# Patient Record
Sex: Female | Born: 1985 | Race: Black or African American | Hispanic: No | Marital: Single | Smoking: Never smoker
Health system: Southern US, Community
[De-identification: ages and names within clinical notes are randomized; demographics above are authoritative.]

## PROBLEM LIST (undated history)

## (undated) DIAGNOSIS — L309 Dermatitis, unspecified: Secondary | ICD-10-CM

## (undated) DIAGNOSIS — F32A Depression, unspecified: Secondary | ICD-10-CM

## (undated) DIAGNOSIS — R87629 Unspecified abnormal cytological findings in specimens from vagina: Secondary | ICD-10-CM

## (undated) DIAGNOSIS — F419 Anxiety disorder, unspecified: Secondary | ICD-10-CM

## (undated) DIAGNOSIS — F329 Major depressive disorder, single episode, unspecified: Secondary | ICD-10-CM

## (undated) DIAGNOSIS — A749 Chlamydial infection, unspecified: Secondary | ICD-10-CM

## (undated) HISTORY — PX: LEEP: SHX91

## (undated) HISTORY — DX: Dermatitis, unspecified: L30.9

## (undated) HISTORY — PX: NO PAST SURGERIES: SHX2092

---

## 2004-02-23 ENCOUNTER — Emergency Department (HOSPITAL_COMMUNITY): Admission: EM | Admit: 2004-02-23 | Discharge: 2004-02-24 | Payer: Self-pay | Admitting: Emergency Medicine

## 2005-06-07 ENCOUNTER — Emergency Department (HOSPITAL_COMMUNITY): Admission: EM | Admit: 2005-06-07 | Discharge: 2005-06-07 | Payer: Self-pay | Admitting: *Deleted

## 2005-11-14 ENCOUNTER — Inpatient Hospital Stay (HOSPITAL_COMMUNITY): Admission: AD | Admit: 2005-11-14 | Discharge: 2005-11-16 | Payer: Self-pay | Admitting: Obstetrics and Gynecology

## 2006-05-11 ENCOUNTER — Emergency Department (HOSPITAL_COMMUNITY): Admission: EM | Admit: 2006-05-11 | Discharge: 2006-05-11 | Payer: Self-pay | Admitting: Emergency Medicine

## 2007-10-26 ENCOUNTER — Inpatient Hospital Stay (HOSPITAL_COMMUNITY): Admission: AD | Admit: 2007-10-26 | Discharge: 2007-10-28 | Payer: Self-pay | Admitting: Obstetrics and Gynecology

## 2008-08-21 ENCOUNTER — Emergency Department (HOSPITAL_COMMUNITY): Admission: EM | Admit: 2008-08-21 | Discharge: 2008-08-21 | Payer: Self-pay | Admitting: Emergency Medicine

## 2008-09-13 ENCOUNTER — Emergency Department (HOSPITAL_COMMUNITY): Admission: EM | Admit: 2008-09-13 | Discharge: 2008-09-13 | Payer: Self-pay | Admitting: Emergency Medicine

## 2008-09-21 ENCOUNTER — Emergency Department (HOSPITAL_COMMUNITY): Admission: EM | Admit: 2008-09-21 | Discharge: 2008-09-21 | Payer: Self-pay | Admitting: Family Medicine

## 2009-07-27 ENCOUNTER — Emergency Department (HOSPITAL_COMMUNITY): Admission: EM | Admit: 2009-07-27 | Discharge: 2009-07-27 | Payer: Self-pay | Admitting: Emergency Medicine

## 2009-08-10 ENCOUNTER — Emergency Department (HOSPITAL_COMMUNITY): Admission: EM | Admit: 2009-08-10 | Discharge: 2009-08-10 | Payer: Self-pay | Admitting: Emergency Medicine

## 2009-12-21 ENCOUNTER — Emergency Department (HOSPITAL_COMMUNITY): Admission: EM | Admit: 2009-12-21 | Discharge: 2009-12-21 | Payer: Self-pay | Admitting: Emergency Medicine

## 2010-02-06 ENCOUNTER — Emergency Department (HOSPITAL_COMMUNITY): Admission: EM | Admit: 2010-02-06 | Discharge: 2010-02-06 | Payer: Self-pay | Admitting: Family Medicine

## 2010-09-06 LAB — POCT I-STAT, CHEM 8
Calcium, Ion: 1.14 mmol/L (ref 1.12–1.32)
Chloride: 106 mEq/L (ref 96–112)
HCT: 41 % (ref 36.0–46.0)
Sodium: 142 mEq/L (ref 135–145)
TCO2: 23 mmol/L (ref 0–100)

## 2010-09-06 LAB — URINALYSIS, ROUTINE W REFLEX MICROSCOPIC
Bilirubin Urine: NEGATIVE
Glucose, UA: NEGATIVE mg/dL
Hgb urine dipstick: NEGATIVE
Ketones, ur: NEGATIVE mg/dL
Protein, ur: >300 mg/dL — AB
Urobilinogen, UA: 0.2 mg/dL (ref 0.0–1.0)

## 2010-09-06 LAB — DIFFERENTIAL
Basophils Absolute: 0 10*3/uL (ref 0.0–0.1)
Eosinophils Absolute: 0.2 10*3/uL (ref 0.0–0.7)
Eosinophils Relative: 2 % (ref 0–5)
Lymphocytes Relative: 14 % (ref 12–46)
Neutrophils Relative %: 81 % — ABNORMAL HIGH (ref 43–77)

## 2010-09-06 LAB — D-DIMER, QUANTITATIVE: D-Dimer, Quant: 0.31 ug/mL-FEU (ref 0.00–0.48)

## 2010-09-06 LAB — URINE MICROSCOPIC-ADD ON

## 2010-09-06 LAB — CBC
HCT: 40.3 % (ref 36.0–46.0)
Platelets: 311 10*3/uL (ref 150–400)
RDW: 13.9 % (ref 11.5–15.5)

## 2010-09-06 LAB — ETHANOL: Alcohol, Ethyl (B): <5 mg/dL (ref 0–10)

## 2010-11-28 ENCOUNTER — Inpatient Hospital Stay (INDEPENDENT_AMBULATORY_CARE_PROVIDER_SITE_OTHER)
Admission: RE | Admit: 2010-11-28 | Discharge: 2010-11-28 | Disposition: A | Discharge status: Home / Self Care | Payer: Medicaid Other | Source: Ambulatory Visit | Attending: Emergency Medicine | Admitting: Emergency Medicine

## 2010-11-28 ENCOUNTER — Ambulatory Visit (INDEPENDENT_AMBULATORY_CARE_PROVIDER_SITE_OTHER): Payer: Medicaid Other

## 2010-11-28 DIAGNOSIS — R071 Chest pain on breathing: Secondary | ICD-10-CM

## 2011-02-21 LAB — CBC
Hemoglobin: 9.9 — ABNORMAL LOW
Platelets: 217
RBC: 3.16 — ABNORMAL LOW
WBC: 14 — ABNORMAL HIGH

## 2011-02-21 LAB — RPR: RPR Ser Ql: NONREACTIVE

## 2011-07-02 ENCOUNTER — Other Ambulatory Visit: Payer: Self-pay

## 2011-07-02 ENCOUNTER — Encounter (HOSPITAL_BASED_OUTPATIENT_CLINIC_OR_DEPARTMENT_OTHER): Payer: Self-pay | Admitting: *Deleted

## 2011-07-02 ENCOUNTER — Emergency Department (HOSPITAL_BASED_OUTPATIENT_CLINIC_OR_DEPARTMENT_OTHER)
Admission: EM | Admit: 2011-07-02 | Discharge: 2011-07-02 | Disposition: A | Discharge status: Home / Self Care | Payer: PRIVATE HEALTH INSURANCE | Attending: Emergency Medicine | Admitting: Emergency Medicine

## 2011-07-02 DIAGNOSIS — R079 Chest pain, unspecified: Secondary | ICD-10-CM | POA: Insufficient documentation

## 2011-07-02 DIAGNOSIS — F329 Major depressive disorder, single episode, unspecified: Secondary | ICD-10-CM | POA: Insufficient documentation

## 2011-07-02 DIAGNOSIS — K0889 Other specified disorders of teeth and supporting structures: Secondary | ICD-10-CM

## 2011-07-02 DIAGNOSIS — K089 Disorder of teeth and supporting structures, unspecified: Secondary | ICD-10-CM | POA: Insufficient documentation

## 2011-07-02 DIAGNOSIS — F411 Generalized anxiety disorder: Secondary | ICD-10-CM

## 2011-07-02 DIAGNOSIS — F3289 Other specified depressive episodes: Secondary | ICD-10-CM | POA: Insufficient documentation

## 2011-07-02 HISTORY — DX: Depression, unspecified: F32.A

## 2011-07-02 HISTORY — DX: Major depressive disorder, single episode, unspecified: F32.9

## 2011-07-02 HISTORY — DX: Anxiety disorder, unspecified: F41.9

## 2011-07-02 MED ORDER — HYDROXYZINE HCL 25 MG PO TABS: ORAL_TABLET | ORAL | Status: DC

## 2011-07-02 MED ORDER — PENICILLIN V POTASSIUM 500 MG PO TABS: 500.0000 mg | ORAL_TABLET | Freq: Four times a day (QID) | ORAL | Status: AC

## 2011-07-02 NOTE — ED Provider Notes (Signed)
History     CSN: [id number]  Arrival date & time 07/02/11  1254   First MD Initiated Contact with Patient 07/02/11 1351      Chief Complaint  Patient presents with  . Anxiety    (Consider location/radiation/quality/duration/timing/severity/associated sxs/prior treatment) HPI Comments: Patient said she had chest pain starting around 7:15 this morning. The pain would come and go. She thought it would get better and so she went to work. 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 Therefore she came to the ED for evaluation.  Patient's mother says that the main thing is going on with patient recently was that she has a badly decayed  tooth and needs to be extracted.  Patient is a 26 y.o. female presenting with chest pain. The history is provided by the patient and a relative. No language interpreter was used.  Chest Pain The chest pain began 6 - 12 hours ago. Episode Length: Patient says the pain waxes and wanes; she still feels some of the pain now. It is felt in the precordial region. Chest pain occurs constantly. Associated with: No specific precipitating cause. At its most intense, the pain is at 8/10. The pain is currently at 8/10. The quality of the pain is described as aching. The pain does not radiate. Pertinent negatives for primary symptoms include no fever. She tried nothing for the symptoms. Risk factors include no known risk factors.     Past Medical History  Diagnosis Date  . Anxiety   . Depression     History reviewed. No pertinent past surgical history.  No family history on file.  History  Substance Use Topics  . Smoking status: Never Smoker   . Smokeless tobacco: Never Used  . Alcohol Use: No    OB History    Grav Para Term Preterm Abortions TAB SAB Ect Mult Living                  Review of Systems  Constitutional: Negative.  Negative for fever and chills.  HENT: Negative.   Eyes: Negative.    Respiratory: Negative.   Cardiovascular: Positive for chest pain.  Gastrointestinal: Negative.   Genitourinary: Negative.   Musculoskeletal: Negative.   Skin: Negative.   Neurological: Negative.   Psychiatric/Behavioral: Negative.     Allergies  Review of patient's allergies indicates no known allergies.  Home Medications  No current outpatient prescriptions on file.  BP 131/81  Pulse 100  Temp(Src) 98.2 F (36.8 C) (Oral)  Resp 20  Ht 5\' 6"  (1.676 m)  Wt 115 lb (52.164 kg)  BMI 18.56 kg/m2  SpO2 100%  LMP 06/17/2011  Physical Exam  Nursing note and vitals reviewed. Constitutional: She is oriented to person, place, and time. She appears well-developed and well-nourished. No distress.  HENT:  Head: Normocephalic and atraumatic.  Right Ear: External ear normal.  Left Ear: External ear normal.       She has a severely decayed right lower first molar.    Eyes: Conjunctivae and EOM are normal. Pupils are equal, round, and reactive to light.  Neck: Normal range of motion. Neck supple.  Cardiovascular: Normal rate, regular rhythm and normal heart sounds.   Pulmonary/Chest: Effort normal and breath sounds normal.  Musculoskeletal: Normal range of motion.  Neurological: She is alert and oriented to person, place, and time.       No sensory or motor deficit.  Skin: Skin  is warm and dry.  Psychiatric: She has a normal mood and affect. Her behavior is normal.    ED Course  Procedures (including critical care time)   1:12 PM  Date: 07/02/2011  Rate:86  Rhythm: normal sinus rhythm  QRS Axis: normal  Intervals: normal  ST/T Wave abnormalities: normal  Conduction Disutrbances:none  Narrative Interpretation: Normal EKG  Old EKG Reviewed: unchanged    Carleene Cooper III, MD 07/02/11 1313  DISP:  Exam shows a decayed tooth, otherwise is normal.  Rx Pen VK 500 mg qid for dental infection, advised to have tooth extracted. Rx anxiety reaction with hydroxyzine prn.    1.  Anxiety reaction   2. Toothache             Carleene Cooper III, MD 07/02/11 [phone]

## 2011-07-02 NOTE — ED Notes (Signed)
Patient states she developed chest pains last night after eating dinner.  States pain worsened this am.  [name] to work while sitting at her desk, she developed intense chest pain, denies sweating, nausea or radiation of pain.  States she has had the same since age 26 yrs, but symptoms have worsened as she had gotten older.

## 2011-07-02 NOTE — ED Provider Notes (Deleted)
1:12 PM  Date: 07/02/2011  Rate:86  Rhythm: normal sinus rhythm  QRS Axis: normal  Intervals: normal  ST/T Wave abnormalities: normal  Conduction Disutrbances:none  Narrative Interpretation: Normal EKG  Old EKG Reviewed: unchanged    Carleene Cooper III, MD 07/02/11 [phone]

## 2011-07-02 NOTE — ED Notes (Signed)
EMS reports patient developed anxiety last night.  Symptoms worsened and is having tightness in her chest.  Hx of same for the last 8 years.  Not currently on medication.

## 2012-01-27 ENCOUNTER — Encounter (HOSPITAL_COMMUNITY): Payer: Self-pay | Admitting: *Deleted

## 2012-01-27 ENCOUNTER — Emergency Department (HOSPITAL_COMMUNITY)
Admission: EM | Admit: 2012-01-27 | Discharge: 2012-01-27 | Discharge status: Against Medical Advice | Payer: PRIVATE HEALTH INSURANCE | Attending: Emergency Medicine | Admitting: Emergency Medicine

## 2012-01-27 DIAGNOSIS — R059 Cough, unspecified: Secondary | ICD-10-CM | POA: Insufficient documentation

## 2012-01-27 DIAGNOSIS — R05 Cough: Secondary | ICD-10-CM | POA: Insufficient documentation

## 2012-01-27 DIAGNOSIS — R51 Headache: Secondary | ICD-10-CM | POA: Insufficient documentation

## 2012-01-27 DIAGNOSIS — J3489 Other specified disorders of nose and nasal sinuses: Secondary | ICD-10-CM | POA: Insufficient documentation

## 2012-01-27 NOTE — ED Notes (Signed)
"  I got this bad headache, think I got a sinus infection" nasal stuffiness, pt coughing non productive, symptoms started this morning

## 2012-01-27 NOTE — ED Notes (Signed)
Call to room 8, no answer

## 2012-01-27 NOTE — ED Notes (Signed)
Called 2nd time with no answer

## 2012-03-27 ENCOUNTER — Emergency Department (HOSPITAL_BASED_OUTPATIENT_CLINIC_OR_DEPARTMENT_OTHER)
Admission: EM | Admit: 2012-03-27 | Discharge: 2012-03-27 | Disposition: A | Discharge status: Home / Self Care | Payer: Self-pay | Attending: Emergency Medicine | Admitting: Emergency Medicine

## 2012-03-27 ENCOUNTER — Emergency Department (HOSPITAL_BASED_OUTPATIENT_CLINIC_OR_DEPARTMENT_OTHER): Payer: Self-pay

## 2012-03-27 ENCOUNTER — Encounter (HOSPITAL_BASED_OUTPATIENT_CLINIC_OR_DEPARTMENT_OTHER): Payer: Self-pay | Admitting: Family Medicine

## 2012-03-27 DIAGNOSIS — F411 Generalized anxiety disorder: Secondary | ICD-10-CM | POA: Insufficient documentation

## 2012-03-27 DIAGNOSIS — Z79899 Other long term (current) drug therapy: Secondary | ICD-10-CM | POA: Insufficient documentation

## 2012-03-27 DIAGNOSIS — F3289 Other specified depressive episodes: Secondary | ICD-10-CM | POA: Insufficient documentation

## 2012-03-27 DIAGNOSIS — R072 Precordial pain: Secondary | ICD-10-CM | POA: Insufficient documentation

## 2012-03-27 DIAGNOSIS — R079 Chest pain, unspecified: Secondary | ICD-10-CM

## 2012-03-27 DIAGNOSIS — F329 Major depressive disorder, single episode, unspecified: Secondary | ICD-10-CM | POA: Insufficient documentation

## 2012-03-27 LAB — BASIC METABOLIC PANEL
CO2: 24 mEq/L (ref 19–32)
Calcium: 9 mg/dL (ref 8.4–10.5)
GFR calc non Af Amer: >90 mL/min (ref >90)
Potassium: 3.2 mEq/L — ABNORMAL LOW (ref 3.5–5.1)
Sodium: 140 mEq/L (ref 135–145)

## 2012-03-27 LAB — CBC WITH DIFFERENTIAL/PLATELET
Basophils Absolute: 0 10*3/uL (ref 0.0–0.1)
Eosinophils Absolute: 0.3 10*3/uL (ref 0.0–0.7)
Eosinophils Relative: 4 % (ref 0–5)
Lymphocytes Relative: 44 % (ref 12–46)
MCV: 91.4 fL (ref 78.0–100.0)
Neutrophils Relative %: 47 % (ref 43–77)
Platelets: 256 10*3/uL (ref 150–400)
RDW: 12.5 % (ref 11.5–15.5)
WBC: 8 10*3/uL (ref 4.0–10.5)

## 2012-03-27 LAB — TROPONIN I: Troponin I: <0.3 ng/mL (ref <0.30)

## 2012-03-27 LAB — D-DIMER, QUANTITATIVE: D-Dimer, Quant: <0.27 ug/mL-FEU (ref 0.00–0.48)

## 2012-03-27 MED ORDER — NAPROXEN 500 MG PO TABS: 500.0000 mg | ORAL_TABLET | Freq: Two times a day (BID) | ORAL | Status: DC

## 2012-03-27 NOTE — ED Notes (Signed)
Patient transported to X-ray 

## 2012-03-27 NOTE — ED Notes (Signed)
Pt c/o central chest pain that started last night. Pt sts pain feels like "pins and needles". Pt denies shob, n/v/d. Pt denies cardiac history.

## 2012-03-27 NOTE — ED Provider Notes (Addendum)
History     CSN: [id number]  Arrival date & time 03/27/12  1318   First MD Initiated Contact with Patient 03/27/12 1340      Chief Complaint  Patient presents with  . Chest Pain    (Consider location/radiation/quality/duration/timing/severity/associated sxs/prior treatment) The history is provided by the patient.   patient is a 26 year old female with acute onset of substernal chest pain between midnight and 3 it was constant reoccurred again this morning has been constant up until now. Substernal in location nonradiating feels like pins and sharp. No shortness of breath no nausea no vomiting no diaphoresis no abdominal pain. No history of chest pain was seen with more of an anxiety shortness of breath problem back in February this is not like that. Patient denies any leg swelling no injuries. Patient has no cardiac risk factors. No PE risk factors. However the chest pain is definitely made worse with breathing. Patient states the pain is 10 out of 10.  Past Medical History  Diagnosis Date  . Anxiety   . Depression     History reviewed. No pertinent past surgical history.  No family history on file.  History  Substance Use Topics  . Smoking status: Never Smoker   . Smokeless tobacco: Never Used  . Alcohol Use: No    OB History    Grav Para Term Preterm Abortions TAB SAB Ect Mult Living                  Review of Systems  Constitutional: Negative for fever.  HENT: Negative for neck pain.   Respiratory: Negative for cough and shortness of breath.   Cardiovascular: Positive for chest pain. Negative for palpitations and leg swelling.  Gastrointestinal: Negative for nausea, vomiting and abdominal pain.  Genitourinary: Negative for dysuria.  Musculoskeletal: Negative for back pain.  Skin: Negative for rash.  Neurological: Negative for headaches.  Hematological: Does not bruise/bleed easily.    Allergies  Review of patient's allergies indicates no known  allergies.  Home Medications   Current Outpatient Rx  Name Route Sig Dispense Refill  . HYDROXYZINE HCL 25 MG PO TABS  Take one tablet if needed for anxiety attack. 20 tablet 0  . NAPROXEN 500 MG PO TABS Oral Take 1 tablet (500 mg total) by mouth 2 (two) times daily. 14 tablet 0    BP 119/91  Pulse 86  Temp 98.1 F (36.7 C) (Oral)  Resp 18  Ht 5' 5.5" (1.664 m)  Wt 115 lb (52.164 kg)  BMI 18.85 kg/m2  SpO2 100%  LMP 02/12/2012  Physical Exam  Nursing note and vitals reviewed. Constitutional: She is oriented to person, place, and time. She appears well-developed and well-nourished. No distress.  HENT:  Head: Normocephalic and atraumatic.  Mouth/Throat: Oropharynx is clear and moist.  Eyes: Conjunctivae normal and EOM are normal. Pupils are equal, round, and reactive to light.  Neck: Normal range of motion. Neck supple.  Cardiovascular: Normal rate, regular rhythm, normal heart sounds and intact distal pulses.   No murmur heard. Pulmonary/Chest: Effort normal and breath sounds normal. No respiratory distress. She has no wheezes. She has no rales. She exhibits no tenderness.  Abdominal: Soft. Bowel sounds are normal. There is no tenderness.  Musculoskeletal: Normal range of motion. She exhibits no edema.  Neurological: She is alert and oriented to person, place, and time. No cranial nerve deficit. She exhibits normal muscle tone. Coordination normal.  Skin: Skin is warm. No rash noted.    ED  Course  Procedures (including critical care time)  Labs Reviewed  CBC WITH DIFFERENTIAL - Abnormal; Notable for the following:    RBC 3.73 (*)     HCT 34.1 (*)     All other components within normal limits  BASIC METABOLIC PANEL - Abnormal; Notable for the following:    Potassium 3.2 (*)     All other components within normal limits  TROPONIN I  D-DIMER, QUANTITATIVE   Dg Chest 2 View  03/27/2012  *RADIOLOGY REPORT*  Clinical Data: 26 year old female with midsternal chest pain.   CHEST - 2 VIEW  Comparison: 11/28/2010.  Findings: Stable and normal lung volumes. Normal cardiac size and mediastinal contours.  Visualized tracheal air column is within normal limits.  The lungs are clear.  No pneumothorax or effusion. No acute osseous abnormality identified.  IMPRESSION: Negative, no acute cardiopulmonary abnormality.   Original Report Authenticated By: Erskine Speed, M.D.      Date: 03/27/2012  Rate: 84  Rhythm: normal sinus rhythm  QRS Axis: normal  Intervals: normal  ST/T Wave abnormalities: normal  Conduction Disutrbances:none  Narrative Interpretation:   Old EKG Reviewed: unchanged From 07/02/11   Results for orders placed during the hospital encounter of 03/27/12  CBC WITH DIFFERENTIAL      Component Value Range   WBC 8.0  4.0 - 10.5 K/uL   RBC 3.73 (*) 3.87 - 5.11 MIL/uL   Hemoglobin 12.1  12.0 - 15.0 g/dL   HCT 16.1 (*) 09.6 - 04.5 %   MCV 91.4  78.0 - 100.0 fL   MCH 32.4  26.0 - 34.0 pg   MCHC 35.5  30.0 - 36.0 g/dL   RDW 40.9  81.1 - 91.4 %   Platelets 256  150 - 400 K/uL   Neutrophils Relative 47  43 - 77 %   Neutro Abs 3.8  1.7 - 7.7 K/uL   Lymphocytes Relative 44  12 - 46 %   Lymphs Abs 3.5  0.7 - 4.0 K/uL   Monocytes Relative 4  3 - 12 %   Monocytes Absolute 0.3  0.1 - 1.0 K/uL   Eosinophils Relative 4  0 - 5 %   Eosinophils Absolute 0.3  0.0 - 0.7 K/uL   Basophils Relative 0  0 - 1 %   Basophils Absolute 0.0  0.0 - 0.1 K/uL  BASIC METABOLIC PANEL      Component Value Range   Sodium 140  135 - 145 mEq/L   Potassium 3.2 (*) 3.5 - 5.1 mEq/L   Chloride 104  96 - 112 mEq/L   CO2 24  19 - 32 mEq/L   Glucose, Bld 90  70 - 99 mg/dL   BUN 7  6 - 23 mg/dL   Creatinine, Ser 7.82  0.50 - 1.10 mg/dL   Calcium 9.0  8.4 - 95.6 mg/dL   GFR calc non Af Amer >90  >90 mL/min   GFR calc Af Amer >90  >90 mL/min  TROPONIN I      Component Value Range   Troponin I <0.30  <0.30 ng/mL  D-DIMER, QUANTITATIVE      Component Value Range   D-Dimer, Quant <0.27   0.00 - 0.48 ug/mL-FEU     1. Chest pain       MDM     Patient with a significant chest pain not worried about cardiac event. EKG without acute changes. Troponin is negative. Main concern would be more for pulmonary embolism d-dimer is pending if  negative patient can be discharged home. Pain is pleuritic in nature. Did consider wells criteria but patient is very concerned about having a blood clot so will proceed with CT angios d-dimer is elevated.  D-dimer does came back is not elevated we'll discharge patient home with atypical chest pain. If the chest x-ray is negative.     Shelda Jakes, MD 03/27/12 1459  Shelda Jakes, MD 03/27/12 1525

## 2012-10-08 ENCOUNTER — Encounter (HOSPITAL_COMMUNITY): Payer: Self-pay | Admitting: Cardiology

## 2012-10-08 ENCOUNTER — Emergency Department (HOSPITAL_COMMUNITY)
Admission: EM | Admit: 2012-10-08 | Discharge: 2012-10-08 | Disposition: A | Discharge status: Home / Self Care | Payer: Self-pay | Attending: Emergency Medicine | Admitting: Emergency Medicine

## 2012-10-08 DIAGNOSIS — Y9389 Activity, other specified: Secondary | ICD-10-CM | POA: Insufficient documentation

## 2012-10-08 DIAGNOSIS — Y929 Unspecified place or not applicable: Secondary | ICD-10-CM | POA: Insufficient documentation

## 2012-10-08 DIAGNOSIS — W260XXA Contact with knife, initial encounter: Secondary | ICD-10-CM | POA: Insufficient documentation

## 2012-10-08 DIAGNOSIS — S61211A Laceration without foreign body of left index finger without damage to nail, initial encounter: Secondary | ICD-10-CM

## 2012-10-08 DIAGNOSIS — S61209A Unspecified open wound of unspecified finger without damage to nail, initial encounter: Secondary | ICD-10-CM | POA: Insufficient documentation

## 2012-10-08 DIAGNOSIS — Z8659 Personal history of other mental and behavioral disorders: Secondary | ICD-10-CM | POA: Insufficient documentation

## 2012-10-08 NOTE — ED Notes (Signed)
Pt discharged.Vital signs stable and GCS 15 

## 2012-10-08 NOTE — ED Notes (Signed)
Pt reports that that she was opening a jar and cut her left index finger. Bleeding controlled.

## 2012-10-08 NOTE — ED Provider Notes (Signed)
History    This chart was scribed for [name] (PA) non-physician practitioner working with Gerhard Munch, MD by Sofie Rower, ED Scribe. This patient was seen in room TR11C/TR11C and the patient's care was started at 7:01PM.   CSN: [id number]  Arrival date & time 10/08/12  1846   First MD Initiated Contact with Patient 10/08/12 1901      Chief Complaint  Patient presents with  . Laceration    (Consider location/radiation/quality/duration/timing/severity/associated sxs/prior treatment) The history is provided by the patient. No language interpreter was used.    Mackenzie Garcia is a 27 y.o. female , with a hx of anxiety and depression, who presents to the Emergency Department complaining of sudden, moderate, laceration, located at the left 2nd finger, onset today (10/08/12). The pt reports she was trying to open the top of a jar with a knife, where which she suddenly slipped, immediately cutting her left second finger. The pt has applied a bandage dressing PTA which controls the bleeding associated with the laceration. The pt is up to date on her tetanus immunization.   The pt does not smoke or drink alcohol.     Past Medical History  Diagnosis Date  . Anxiety   . Depression     History reviewed. No pertinent past surgical history.  History reviewed. No pertinent family history.  History  Substance Use Topics  . Smoking status: Never Smoker   . Smokeless tobacco: Never Used  . Alcohol Use: No    OB History   Grav Para Term Preterm Abortions TAB SAB Ect Mult Living                  Review of Systems  Constitutional: Negative for fever and chills.  Skin: Positive for wound.  All other systems reviewed and are negative.    Allergies  Review of patient's allergies indicates no known allergies.  Home Medications   Current Outpatient Rx  Name  Route  Sig  Dispense  Refill  . medroxyPROGESTERone (DEPO-PROVERA) 150 MG/ML injection   Intramuscular   Inject  150 mg into the muscle every 3 (three) months.           BP 130/82  Pulse 98  Temp(Src) 98.3 F (36.8 C) (Oral)  Resp 18  SpO2 98%  Physical Exam  Nursing note and vitals reviewed. Constitutional: She is oriented to person, place, and time. She appears well-developed and well-nourished. No distress.  HENT:  Head: Normocephalic and atraumatic.  Right Ear: External ear normal.  Left Ear: External ear normal.  Nose: Nose normal.  Mouth/Throat: Oropharynx is clear and moist.  Eyes: Conjunctivae are normal.  Neck: Normal range of motion.  Cardiovascular: Normal rate, regular rhythm and normal heart sounds.   Pulmonary/Chest: Effort normal and breath sounds normal. No stridor. No respiratory distress. She has no wheezes. She has no rales.  Abdominal: Soft. She exhibits no distension.  Musculoskeletal: Normal range of motion.  Neurological: She is alert and oriented to person, place, and time. She has normal strength.     Skin: Skin is warm and dry. Laceration noted. She is not diaphoretic. No erythema.  2 cm laceration located at the left index finger. Flexion and extension intact, only mildly tender to palpation  neurovascularly intact   Psychiatric: She has a normal mood and affect. Her behavior is normal.    ED Course  Procedures (including critical care time)  DIAGNOSTIC STUDIES: Oxygen Saturation is 98% on room air, normal by my interpretation.  COORDINATION OF CARE:    7:20 PM- Treatment plan concerning laceration repair discussed with patient. Pt agrees with treatment.  LACERATION REPAIR Performed by: [name] Authorized by: [name] Consent: Verbal consent obtained. Risks and benefits: risks, benefits and alternatives were discussed Consent given by: patient Patient identity confirmed: provided demographic data Prepped and Draped in normal sterile fashion Wound explored  Laceration Location: left index finger  Laceration Length: 2  cm  No Foreign Bodies seen or palpated  Anesthesia: local infiltration  Local anesthetic: lidocaine 2%  Anesthetic total: 3 ml  Irrigation method: syringe Amount of cleaning: standard  Skin closure: 4-0 Ethilon  Number of sutures: 2  Technique: simple interrupted   Patient tolerance: Patient tolerated the procedure well with no immediate complications.     Labs Reviewed - No data to display No results found.   1. Laceration of left index finger without foreign body without damage to nail, initial encounter       MDM  Patient presents today with laceration of left index finger. No foreign bodies visualized. Laceration repair done without complication. Tetanus was up to date. No need for abx. Return in 7 days for suture removal. Return instructions given. Vital signs stable for discharge. Patient / Family / Caregiver informed of clinical course, understand medical decision-making process, and agree with plan.    I personally performed the services described in this documentation, which was scribed in my presence. The recorded information has been reviewed and is accurate.     Mora Bellman, PA-C 10/09/12 1148

## 2012-10-10 NOTE — ED Provider Notes (Signed)
Medical screening examination/treatment/procedure(s) were performed by non-physician practitioner and as supervising physician I was immediately available for consultation/collaboration.   Gerhard Munch, MD 10/10/12 [phone]

## 2013-12-16 IMAGING — CR DG CHEST 2V
2 series · 2 of 2 positions shown · non-contrast
Comparison: 11/28/2010.

CLINICAL DATA: 26-year-old female with midsternal chest pain.

CHEST - 2 VIEW

[w chest pa]
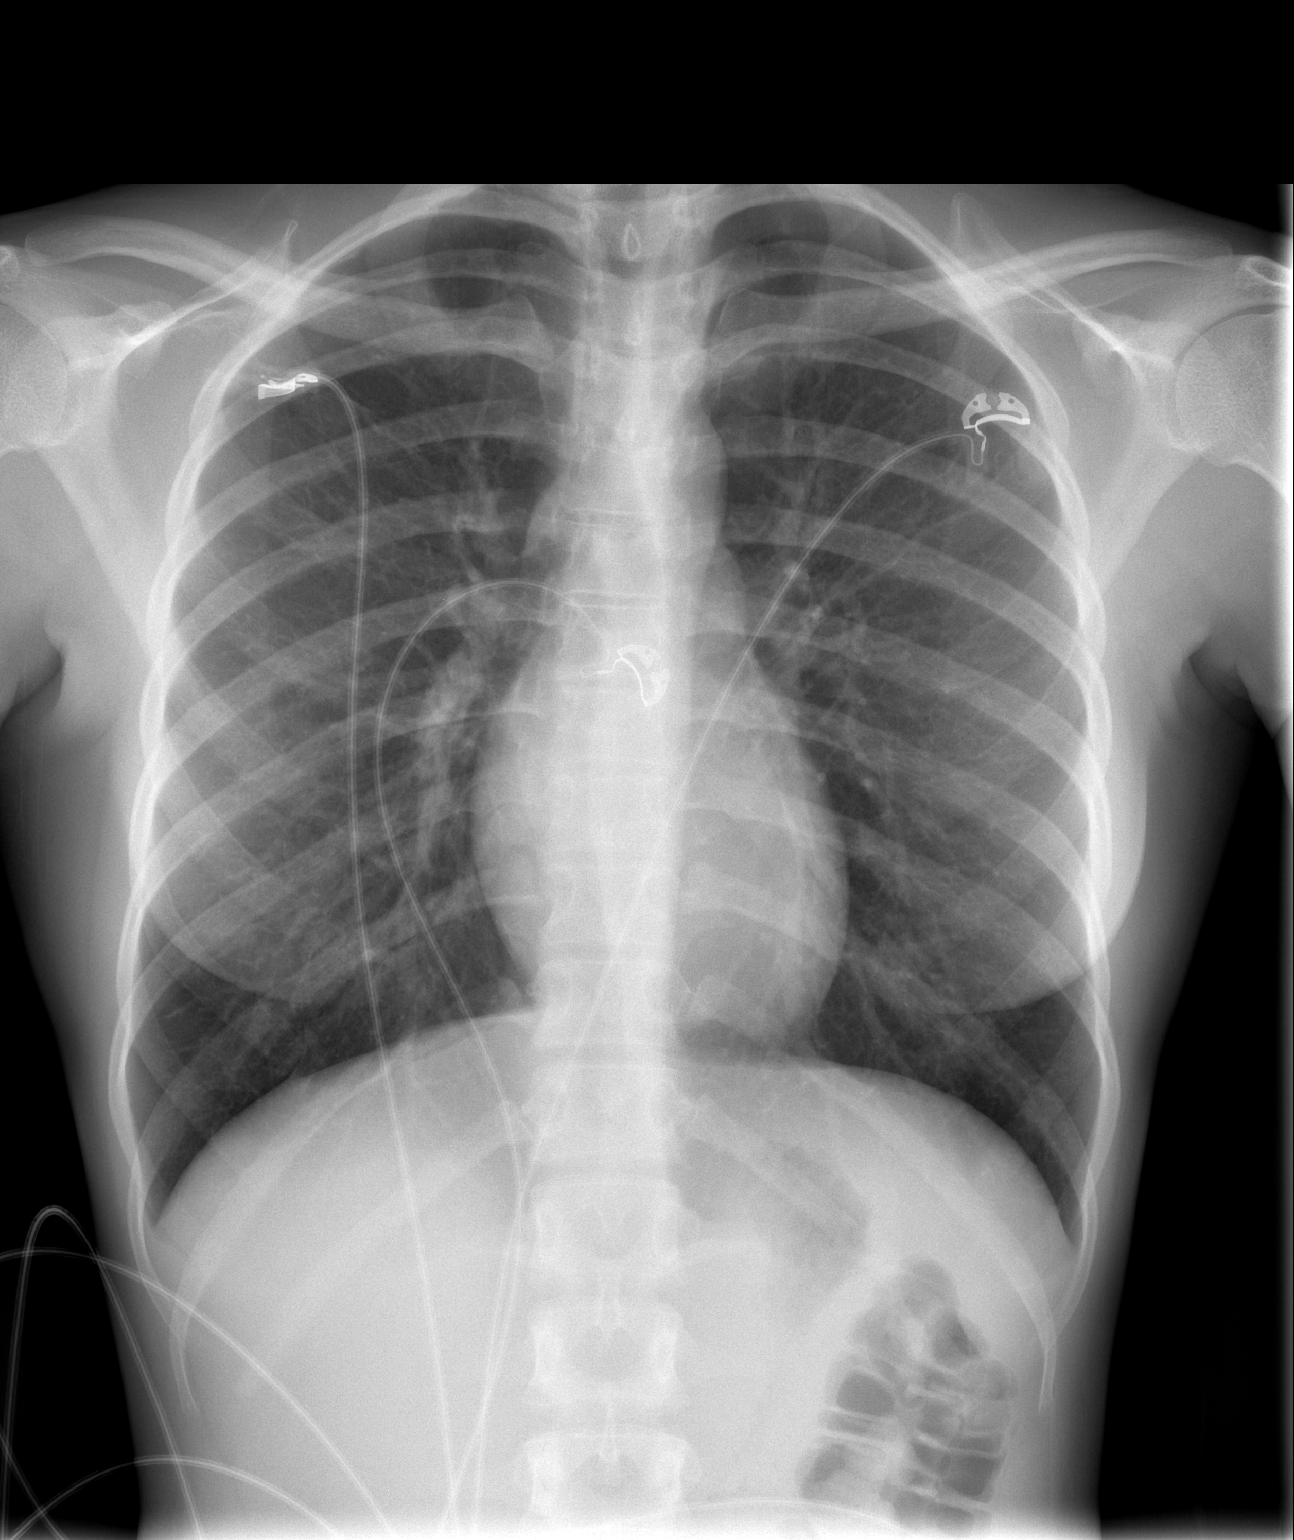

[w chest lat]
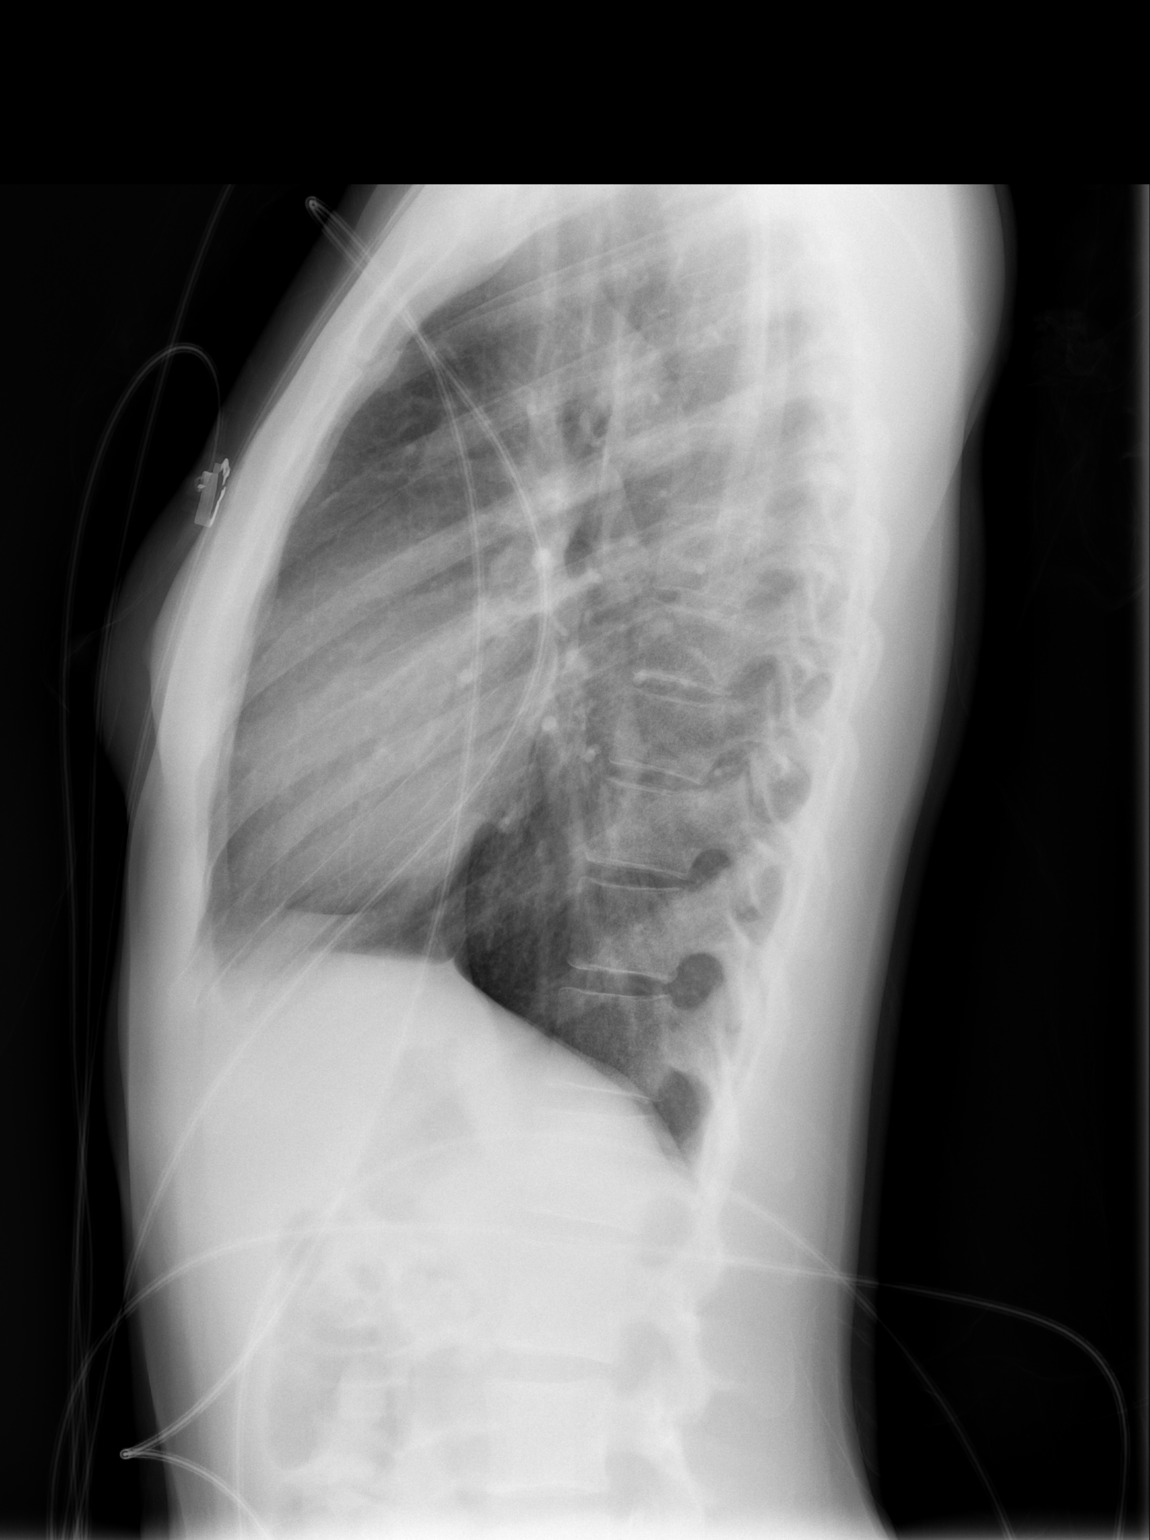

[2 of 2 positions shown; findings below may reference images not displayed]

FINDINGS: Stable and normal lung volumes. Normal cardiac size and
mediastinal contours.  Visualized tracheal air column is within
normal limits.  The lungs are clear.  No pneumothorax or effusion.
No acute osseous abnormality identified.
IMPRESSION: Negative, no acute cardiopulmonary abnormality.

## 2014-03-01 ENCOUNTER — Encounter (HOSPITAL_COMMUNITY): Payer: Self-pay | Admitting: *Deleted

## 2014-03-01 ENCOUNTER — Inpatient Hospital Stay (HOSPITAL_COMMUNITY)
Admission: AD | Admit: 2014-03-01 | Discharge: 2014-03-01 | Disposition: A | Discharge status: Home / Self Care | Payer: PRIVATE HEALTH INSURANCE | Source: Ambulatory Visit | Attending: Obstetrics & Gynecology | Admitting: Obstetrics & Gynecology

## 2014-03-01 DIAGNOSIS — Z202 Contact with and (suspected) exposure to infections with a predominantly sexual mode of transmission: Secondary | ICD-10-CM | POA: Insufficient documentation

## 2014-03-01 LAB — URINALYSIS, ROUTINE W REFLEX MICROSCOPIC
Bilirubin Urine: NEGATIVE
GLUCOSE, UA: NEGATIVE mg/dL
Ketones, ur: NEGATIVE mg/dL
Nitrite: NEGATIVE
PH: 7 (ref 5.0–8.0)
PROTEIN: NEGATIVE mg/dL
SPECIFIC GRAVITY, URINE: 1.02 (ref 1.005–1.030)
Urobilinogen, UA: 0.2 mg/dL (ref 0.0–1.0)

## 2014-03-01 LAB — URINE MICROSCOPIC-ADD ON

## 2014-03-01 LAB — WET PREP, GENITAL
CLUE CELLS WET PREP: NONE SEEN
TRICH WET PREP: NONE SEEN
YEAST WET PREP: NONE SEEN

## 2014-03-01 LAB — RPR

## 2014-03-01 LAB — POCT PREGNANCY, URINE: PREG TEST UR: NEGATIVE

## 2014-03-01 LAB — HIV ANTIBODY (ROUTINE TESTING W REFLEX): HIV 1&2 Ab, 4th Generation: NONREACTIVE

## 2014-03-01 MED ORDER — CEFTRIAXONE SODIUM 250 MG IJ SOLR
250.0000 mg | Freq: Once | INTRAMUSCULAR | Status: AC
  Administered 2014-03-01: 250 mg via INTRAMUSCULAR
  Filled 2014-03-01: qty 250

## 2014-03-01 MED ORDER — AZITHROMYCIN 500 MG PO TABS: 1000.0000 mg | ORAL_TABLET | Freq: Every day | ORAL | Status: DC

## 2014-03-01 NOTE — MAU Note (Signed)
Father of pt's children was seen and treated for STD and pt desires to have STD check; denies pain but does have vaginal discharge; was treated for BV recently;

## 2014-03-01 NOTE — MAU Provider Note (Signed)
I examined pt and agree with documentation above and resident plan of care. Walidah N Karim, CNM  

## 2014-03-01 NOTE — MAU Provider Note (Signed)
History     CSN: [id number]  Arrival date and time: 03/01/14 1129   None     Chief Complaint  Patient presents with  . Exposure to STD   HPI 89F, [id number], presents for STD check.  Told by a former partner (last sex in August) that is chlamydia positive. One sexual partner this past year. History trichomonas x1 in 2007 and chlamydia x1 in 2009. No fevers or chills, no nausea or vomiting, no back pain, no abdominal pain, no diarrhea or constipation. Does endorse vaginal discharge more than usual for one week, no odor. No vaginal pain, burning, or pruritus. Treated for BV in July.    Past Medical History  Diagnosis Date  . Anxiety   . Depression     Past Surgical History  Procedure Laterality Date  . No past surgeries      Family History  Problem Relation Age of Onset  . Diabetes Father   . Diabetes Maternal Uncle   . Diabetes Maternal Grandmother     History  Substance Use Topics  . Smoking status: Never Smoker   . Smokeless tobacco: Never Used  . Alcohol Use: No    Allergies: No Known Allergies  Prescriptions prior to admission  Medication Sig Dispense Refill  . medroxyPROGESTERone (DEPO-PROVERA) 150 MG/ML injection Inject 150 mg into the muscle every 3 (three) months.        Review of Systems  Constitutional: Negative for fever and chills.  Respiratory: Negative for cough.   Cardiovascular: Negative for chest pain.  Gastrointestinal: Negative for nausea, vomiting, abdominal pain and constipation.  Genitourinary: Negative for dysuria, urgency, frequency and hematuria.  Musculoskeletal: Negative for myalgias.  Skin: Negative for rash.  Neurological: Negative for weakness and headaches.  Psychiatric/Behavioral: Negative for depression.   Physical Exam   Blood pressure 124/76, pulse 98, temperature 98.5 F (36.9 C), temperature source Oral, resp. rate 18, height 5' 5.5" (1.664 m), weight 55.792 kg (123 lb).  Physical Exam  Constitutional: She is  oriented to person, place, and time. She appears well-developed and well-nourished.  HENT:  Head: Normocephalic and atraumatic.  Eyes: Conjunctivae are normal.  Neck: Normal range of motion.  Cardiovascular: Normal rate, regular rhythm, normal heart sounds and intact distal pulses.   Respiratory: Effort normal and breath sounds normal.  GI: Soft. Bowel sounds are normal. She exhibits no distension and no mass. There is no tenderness. There is no rebound and no guarding.  Genitourinary: Vagina normal and uterus normal.  Cervix normal. Moderate amount white/yellow discharge. No cervical/uterine/adnexal tenderness. No adnexal mass.  Neurological: She is alert and oriented to person, place, and time.  Skin: Skin is warm and dry.    Results for orders placed during the hospital encounter of 03/01/14 (from the past 24 hour(s))  URINALYSIS, ROUTINE W REFLEX MICROSCOPIC     Status: Abnormal   Collection Time    03/01/14 11:41 AM      Result Value Ref Range   Color, Urine YELLOW  YELLOW   APPearance CLEAR  CLEAR   Specific Gravity, Urine 1.020  1.005 - 1.030   pH 7.0  5.0 - 8.0   Glucose, UA NEGATIVE  NEGATIVE mg/dL   Hgb urine dipstick SMALL (*) NEGATIVE   Bilirubin Urine NEGATIVE  NEGATIVE   Ketones, ur NEGATIVE  NEGATIVE mg/dL   Protein, ur NEGATIVE  NEGATIVE mg/dL   Urobilinogen, UA 0.2  0.0 - 1.0 mg/dL   Nitrite NEGATIVE  NEGATIVE   Leukocytes, UA SMALL (*)  NEGATIVE  URINE MICROSCOPIC-ADD ON     Status: Abnormal   Collection Time    03/01/14 11:41 AM      Result Value Ref Range   Squamous Epithelial / LPF FEW (*) RARE   WBC, UA 3-6  <3 WBC/hpf   RBC / HPF 3-6  <3 RBC/hpf   Bacteria, UA RARE  RARE   Urine-Other MUCOUS PRESENT    POCT PREGNANCY, URINE     Status: None   Collection Time    03/01/14 11:51 AM      Result Value Ref Range   Preg Test, Ur NEGATIVE  NEGATIVE  WET PREP, GENITAL     Status: Abnormal   Collection Time    03/01/14 12:29 PM      Result Value Ref Range    Yeast Wet Prep HPF POC NONE SEEN  NONE SEEN   Trich, Wet Prep NONE SEEN  NONE SEEN   Clue Cells Wet Prep HPF POC NONE SEEN  NONE SEEN   WBC, Wet Prep HPF POC MANY (*) NONE SEEN     MAU Course  Procedures   Assessment and Plan  10F, not pregnant, here for STI evaluation in setting of recent sexual partner diagnosis with chlamydia. Patient is asymptomatic. Here cervical NAA obtained for GC/chlamydia as well as serum for HIV and syphilis. Upreg negative. Wet prep negative, urinalysis not suggestive of infection. Will treat empirically for gonorrohea and chlamydia. Is s/p ceftriaxone 250 mg IM once. - home with prescription for azithromycin 1 g po once - infection return precautions provided - f/u GC/chlamydia/HIV/RPR  WOHLERT, Amaziah Ghosh 03/01/2014, 12:09 PM

## 2014-03-02 LAB — GC/CHLAMYDIA PROBE AMP
CT Probe RNA: NEGATIVE
GC Probe RNA: NEGATIVE

## 2014-03-28 ENCOUNTER — Encounter (HOSPITAL_COMMUNITY): Payer: Self-pay | Admitting: *Deleted

## 2014-10-24 ENCOUNTER — Inpatient Hospital Stay (HOSPITAL_COMMUNITY)
Admission: AD | Admit: 2014-10-24 | Discharge: 2014-10-24 | Disposition: A | Discharge status: Home / Self Care | Payer: PRIVATE HEALTH INSURANCE | Source: Ambulatory Visit | Attending: Obstetrics and Gynecology | Admitting: Obstetrics and Gynecology

## 2014-10-24 ENCOUNTER — Encounter (HOSPITAL_COMMUNITY): Payer: Self-pay | Admitting: *Deleted

## 2014-10-24 DIAGNOSIS — Z833 Family history of diabetes mellitus: Secondary | ICD-10-CM | POA: Insufficient documentation

## 2014-10-24 DIAGNOSIS — Z202 Contact with and (suspected) exposure to infections with a predominantly sexual mode of transmission: Secondary | ICD-10-CM | POA: Diagnosis not present

## 2014-10-24 HISTORY — DX: Unspecified abnormal cytological findings in specimens from vagina: R87.629

## 2014-10-24 HISTORY — DX: Chlamydial infection, unspecified: A74.9

## 2014-10-24 LAB — URINE MICROSCOPIC-ADD ON

## 2014-10-24 LAB — URINALYSIS, ROUTINE W REFLEX MICROSCOPIC
Bilirubin Urine: NEGATIVE
Glucose, UA: NEGATIVE mg/dL
Ketones, ur: 15 mg/dL — AB
LEUKOCYTES UA: NEGATIVE
Nitrite: NEGATIVE
PROTEIN: NEGATIVE mg/dL
Specific Gravity, Urine: 1.025 (ref 1.005–1.030)
Urobilinogen, UA: 1 mg/dL (ref 0.0–1.0)
pH: 6 (ref 5.0–8.0)

## 2014-10-24 LAB — POCT PREGNANCY, URINE: PREG TEST UR: NEGATIVE

## 2014-10-24 MED ORDER — AZITHROMYCIN 500 MG PO TABS: 1000.0000 mg | ORAL_TABLET | Freq: Once | ORAL | Status: DC

## 2014-10-24 NOTE — MAU Note (Signed)
Pt was seen at HD Tuesday the 24th. Diagnosed with Chlamydia and told to return to HD on Thursday 6/2 for treatment. Patient wants treatment today. Boyfriend has be diagnosed and treated for chlamydia this past week as well. Denies any other problems.

## 2014-10-24 NOTE — MAU Provider Note (Signed)
  History     CSN: [id number]  Arrival date and time: 10/24/14 1509   First Provider Initiated Contact with Patient 10/24/14 1531      Chief Complaint  Patient presents with  . Vaginitis   HPI Mackenzie Garcia 29 y.o. [phone] nonpregnant female presents for chlamydia treatment.  [facility] one week ago with complete exam and notified by phone that her chlamydia test is positive.  She desires treatment here today.  She denies fever, sob, cp, weakness.   OB History    Gravida Para Term Preterm AB TAB SAB Ectopic Multiple Living   2 2 2       2       Past Medical History  Diagnosis Date  . Anxiety   . Depression   . Vaginal Pap smear, abnormal   . Chlamydia     Past Surgical History  Procedure Laterality Date  . No past surgeries    . Leep      Family History  Problem Relation Age of Onset  . Diabetes Father   . Diabetes Maternal Uncle   . Diabetes Maternal Grandmother     History  Substance Use Topics  . Smoking status: Never Smoker   . Smokeless tobacco: Never Used  . Alcohol Use: No    Allergies: No Known Allergies  Prescriptions prior to admission  Medication Sig Dispense Refill Last Dose  . azithromycin (ZITHROMAX) 500 MG tablet Take 2 tablets (1,000 mg total) by mouth daily. 2 tablet 0   . medroxyPROGESTERone (DEPO-PROVERA) 150 MG/ML injection Inject 150 mg into the muscle every 3 (three) months.   3/14    ROS Pertinent ROS in HPI.  All other systems are negative.   Physical Exam   Blood pressure 117/67, pulse 84, temperature 98.4 F (36.9 C), temperature source Oral, resp. rate 18.  Physical Exam  Constitutional: She is oriented to person, place, and time. She appears well-developed and well-nourished. No distress.  HENT:  Head: Normocephalic and atraumatic.  Eyes: EOM are normal.  Neck: Normal range of motion.  Cardiovascular: Normal rate.   Respiratory: Effort normal. No respiratory distress.  Musculoskeletal: Normal range of  motion.  Neurological: She is alert and oriented to person, place, and time.  Skin: Skin is warm and dry.  Psychiatric: She has a normal mood and affect.    MAU Course  Procedures  MDM STD exposure per pt  Assessment and Plan  A: Contact to chlamydia  P: Discharge home Azith 1 gram po x 1.  Pt desires rx for home rather than waiting to receive here.   F/u with [facility].   Patient may return to MAU as needed or if her condition were to change or worsen   Bertram Denvereague Clark, Karen E 10/24/2014, 3:31 PM

## 2014-10-24 NOTE — Discharge Instructions (Signed)
Chlamydia Chlamydia is an infection. It is spread through sexual contact. Chlamydia can be in different areas of the body. These areas include the cervix, urethra, throat, or rectum. You may not know you have chlamydia because many people never develop the symptoms. Chlamydia is not difficult to treat once you know you have it. However, if it is left untreated, chlamydia can lead to more serious health problems.  CAUSES  Chlamydia is caused by bacteria. It is a sexually transmitted disease. It is passed from an infected partner during intimate contact. This contact could be with the genitals, mouth, or rectal area. Chlamydia can also be passed from mothers to babies during birth. SIGNS AND SYMPTOMS  There may not be any symptoms. This is often the case early in the infection. If symptoms develop, they may include:  Mild pain and discomfort when urinating.  Redness, soreness, and swelling (inflammation) of the rectum.  Vaginal discharge.  Painful intercourse.  Abdominal pain.  Bleeding between menstrual periods. DIAGNOSIS  To diagnose this infection, your health care provider will do a pelvic exam. Cultures will be taken of the vagina, cervix, urine, and possibly the rectum to verify the diagnosis.  TREATMENT You will be given antibiotic medicines. If you are pregnant, certain types of antibiotics will need to be avoided. Any sexual partners should also be treated, even if they do not show symptoms.  HOME CARE INSTRUCTIONS   Take your antibiotic medicine as directed by your health care provider. Finish the antibiotic even if you start to feel better.  Take medicines only as directed by your health care provider.  Inform any sexual partners about the infection. They should also be treated.  Do not have sexual contact until your health care provider tells you it is okay.  Get plenty of rest.  Eat a well-balanced diet.  Drink enough fluids to keep your urine clear or pale  yellow.  Keep all follow-up visits as directed by your health care provider. SEEK MEDICAL CARE IF:  You have painful urination.  You have abdominal pain.  You have vaginal discharge.  You have painful sexual intercourse.  You have bleeding between periods and after sex.  You have a fever. SEEK IMMEDIATE MEDICAL CARE IF:   You experience nausea or vomiting.  You experience excessive sweating (diaphoresis).  You have difficulty swallowing. MAKE SURE YOU:   Understand these instructions.  Will watch your condition.  Will get help right away if you are not doing well or get worse. Document Released: 02/20/2005 Document Revised: 09/27/2013 Document Reviewed: 01/18/2013 ExitCare Patient Information 2015 [employer]. This information is not intended to replace advice given to you by your health care provider. Make sure you discuss any questions you have with your health care provider.  

## 2015-05-10 ENCOUNTER — Telehealth (HOSPITAL_COMMUNITY): Payer: Self-pay | Admitting: *Deleted

## 2015-05-10 NOTE — Telephone Encounter (Signed)
[facility] Attempted to call patient to schedule appointment. No one answered phone. Left voicemail for patient to call me back.

## 2015-05-15 ENCOUNTER — Telehealth (HOSPITAL_COMMUNITY): Payer: Self-pay | Admitting: *Deleted

## 2015-05-15 NOTE — Telephone Encounter (Signed)
Telephoned patient at mobile # and is unavailable now.

## 2015-06-05 ENCOUNTER — Telehealth (HOSPITAL_COMMUNITY): Payer: Self-pay | Admitting: *Deleted

## 2015-06-05 NOTE — Telephone Encounter (Signed)
Telephoned patient at home # and left message to return call to [facility]. This is the 3rd attempt with no return call.

## 2015-08-29 ENCOUNTER — Encounter (HOSPITAL_COMMUNITY): Payer: Self-pay | Admitting: *Deleted

## 2015-09-28 ENCOUNTER — Ambulatory Visit (HOSPITAL_COMMUNITY): Payer: PRIVATE HEALTH INSURANCE

## 2015-09-29 ENCOUNTER — Encounter: Payer: PRIVATE HEALTH INSURANCE | Admitting: Family Medicine

## 2015-12-26 ENCOUNTER — Ambulatory Visit (HOSPITAL_COMMUNITY)
Admission: EM | Admit: 2015-12-26 | Discharge: 2015-12-26 | Disposition: A | Discharge status: Home / Self Care | Payer: 59 | Attending: Family Medicine | Admitting: Family Medicine

## 2015-12-26 ENCOUNTER — Encounter (HOSPITAL_COMMUNITY): Payer: Self-pay | Admitting: Emergency Medicine

## 2015-12-26 DIAGNOSIS — R42 Dizziness and giddiness: Secondary | ICD-10-CM | POA: Diagnosis not present

## 2015-12-26 DIAGNOSIS — H8309 Labyrinthitis, unspecified ear: Secondary | ICD-10-CM | POA: Diagnosis not present

## 2015-12-26 MED ORDER — ONDANSETRON 4 MG PO TBDP
ORAL_TABLET | ORAL | Status: AC
  Filled 2015-12-26: qty 1

## 2015-12-26 MED ORDER — MECLIZINE HCL 25 MG PO TABS: 25.0000 mg | ORAL_TABLET | Freq: Three times a day (TID) | ORAL | 0 refills | Status: DC | PRN

## 2015-12-26 MED ORDER — ONDANSETRON HCL 4 MG/2ML IJ SOLN: 4.0000 mg | Freq: Once | INTRAMUSCULAR | Status: DC

## 2015-12-26 MED ORDER — ONDANSETRON 4 MG PO TBDP
4.0000 mg | ORAL_TABLET | Freq: Once | ORAL | Status: AC
  Administered 2015-12-26: 4 mg via ORAL

## 2015-12-26 NOTE — ED Triage Notes (Signed)
Pt states she started having mid back pain, bilaterally, at work.  She started feeling dizzy and her head began to hurt and she feels like she has generalized body aches.  She denies any fever or any urinary symptoms.

## 2015-12-26 NOTE — ED Provider Notes (Addendum)
CSN: [id number]     Arrival date & time 12/26/15  1417 History   First MD Initiated Contact with Patient 12/26/15 1542     Chief Complaint  Patient presents with  . Dizziness  . Headache  . Back Pain   (Consider location/radiation/quality/duration/timing/severity/associated sxs/prior Treatment) Patient c/o room spinning and nausea.  She had to go home from work today.   The history is provided by the patient.  Dizziness  Quality:  Head spinning and imbalance Severity:  Moderate Onset quality:  Sudden Duration:  3 hours Timing:  Constant Progression:  Worsening Chronicity:  New Relieved by:  Nothing Worsened by:  Movement Ineffective treatments:  None tried Associated symptoms: headaches   Headache  Associated symptoms: back pain, dizziness and drainage   Back Pain  Associated symptoms: headaches     Past Medical History:  Diagnosis Date  . Anxiety   . Chlamydia   . Depression   . Vaginal Pap smear, abnormal    Past Surgical History:  Procedure Laterality Date  . LEEP    . NO PAST SURGERIES     Family History  Problem Relation Age of Onset  . Diabetes Father   . Diabetes Maternal Uncle   . Diabetes Maternal Grandmother    Social History  Substance Use Topics  . Smoking status: Never Smoker  . Smokeless tobacco: Never Used  . Alcohol use No   OB History    Gravida Para Term Preterm AB Living   SAB TAB Ectopic Multiple Live Births                 Review of Systems  Constitutional: Negative.   HENT: Positive for postnasal drip.   Eyes: Negative.   Respiratory: Negative.   Cardiovascular: Negative.   Gastrointestinal: Negative.   Endocrine: Negative.   Genitourinary: Negative.   Musculoskeletal: Positive for back pain.  Skin: Negative.   Allergic/Immunologic: Negative.   Neurological: Positive for dizziness and headaches.  Hematological: Negative.   Psychiatric/Behavioral: Negative.     Allergies  Review of patient's allergies  indicates no known allergies.  Home Medications   Prior to Admission medications   Medication Sig Start Date End Date Taking? Authorizing Provider  medroxyPROGESTERone (DEPO-PROVERA) 150 MG/ML injection Inject 150 mg into the muscle every 3 (three) months.   Yes Historical Provider, MD  azithromycin (ZITHROMAX) 500 MG tablet Take 2 tablets (1,000 mg total) by mouth once. 10/24/14   Bertram Denver, PA-C  meclizine (ANTIVERT) 25 MG tablet Take 1 tablet (25 mg total) by mouth 3 (three) times daily as needed for dizziness. 12/26/15   Deatra Canter, FNP   Meds Ordered and Administered this Visit   Medications  ondansetron (ZOFRAN-ODT) disintegrating tablet 4 mg (4 mg Oral Given 12/26/15 1610)    BP 109/66 (BP Location: Left Arm)   Pulse 111   Temp 99.6 F (37.6 C) (Oral)   Resp 12   SpO2 100%  No data found.   Physical Exam  Constitutional: She is oriented to person, place, and time. She appears well-developed and well-nourished.  HENT:  Head: Normocephalic and atraumatic.  Right Ear: External ear normal.  Left Ear: External ear normal.  Mouth/Throat: Oropharynx is clear and moist.  Nystagmus and dizziness with EOMI  Eyes: EOM are normal. Pupils are equal, round, and reactive to light.  Neck: Normal range of motion. Neck supple.  Cardiovascular: Normal rate, regular rhythm and normal heart  sounds.   Pulmonary/Chest: Effort normal and breath sounds normal.  Abdominal: Soft. Bowel sounds are normal.  Musculoskeletal: Normal range of motion.  Neurological: She is alert and oriented to person, place, and time.  Nursing note and vitals reviewed.   Urgent Care Course   Clinical Course    Procedures (including critical care time)  Labs Review Labs Reviewed - No data to display  Imaging Review No results found.   Visual Acuity Review  Right Eye Distance:   Left Eye Distance:   Bilateral Distance:    Right Eye Near:   Left Eye Near:    Bilateral Near:          MDM   1. Labyrinthitis, unspecified laterality   2. Dizziness    Antivert 25mg  one po tid prn #21 Zofran 4mg  one po now    Deatra Canter, FNP 12/26/15 1655    Deatra Canter, [place] 01/02/16 [phone]

## 2015-12-26 NOTE — ED Notes (Signed)
Pt called needing extended work note. Gave chart to [facility] PA, He wrote work excuse return 12/28/15. Notified pt via phone that note will be at front desk.

## 2016-09-11 ENCOUNTER — Ambulatory Visit (HOSPITAL_COMMUNITY)
Admission: EM | Admit: 2016-09-11 | Discharge: 2016-09-11 | Disposition: A | Discharge status: Home / Self Care | Payer: Managed Care, Other (non HMO) | Attending: Family Medicine | Admitting: Family Medicine

## 2016-09-11 ENCOUNTER — Encounter (HOSPITAL_COMMUNITY): Payer: Self-pay | Admitting: Emergency Medicine

## 2016-09-11 DIAGNOSIS — J683 Other acute and subacute respiratory conditions due to chemicals, gases, fumes and vapors: Secondary | ICD-10-CM

## 2016-09-11 DIAGNOSIS — F410 Panic disorder [episodic paroxysmal anxiety] without agoraphobia: Secondary | ICD-10-CM

## 2016-09-11 MED ORDER — ALBUTEROL SULFATE HFA 108 (90 BASE) MCG/ACT IN AERS: 2.0000 | INHALATION_SPRAY | RESPIRATORY_TRACT | 1 refills | Status: DC | PRN

## 2016-09-11 MED ORDER — ALPRAZOLAM 0.25 MG PO TABS: 0.2500 mg | ORAL_TABLET | Freq: Every day | ORAL | 0 refills | Status: DC | PRN

## 2016-09-11 NOTE — ED Triage Notes (Signed)
The patient presented to the [facility] with a complaint of HTN after having an anxiety attack at work today.

## 2016-09-11 NOTE — ED Provider Notes (Signed)
[facility]    CSN: [id number] Arrival date & time: 09/11/16  1444     History   Chief Complaint Chief Complaint  Patient presents with  . Anxiety    HPI Mackenzie Garcia is a 31 y.o. female.   The patient presented to the [facility] with a complaint of HTN after having an anxiety attack at work today.  Patient's had wheezing and some shortness of breath the last few days. She took her son's albuterol inhaler this morning. Each of her two sons have asthma. She does not smoke nor has she had the diagnosis of asthma.  She is at work at [employer] today when she felt tightness coming on in her chest. She started breathing and a paper bag. Her symptoms resolved in 15 minutes. She had a similar episode about 5-7 years ago.      Past Medical History:  Diagnosis Date  . Anxiety   . Chlamydia   . Depression   . Vaginal Pap smear, abnormal     There are no active problems to display for this patient.   Past Surgical History:  Procedure Laterality Date  . LEEP    . NO PAST SURGERIES      OB History    Gravida Para Term Preterm AB Living   SAB TAB Ectopic Multiple Live Births                   Home Medications    Prior to Admission medications   Medication Sig Start Date End Date Taking? Authorizing Provider  albuterol (PROVENTIL HFA;VENTOLIN HFA) 108 (90 Base) MCG/ACT inhaler Inhale 2 puffs into the lungs every 4 (four) hours as needed for wheezing or shortness of breath (cough, shortness of breath or wheezing.). 09/11/16   [name], MD  ALPRAZolam [name]) 0.25 MG tablet Take 1 tablet (0.25 mg total) by mouth daily as needed for anxiety. 09/11/16   [name], MD    Family History Family History  Problem Relation Age of Onset  . Diabetes Father   . Diabetes Maternal Uncle   . Diabetes Maternal Grandmother     Social History Social History  Substance Use Topics  . Smoking status: Never Smoker  . Smokeless tobacco: Never Used  .  Alcohol use No     Allergies   Patient has no known allergies.   Review of Systems Review of Systems  HENT: Negative.   Respiratory: Positive for chest tightness, shortness of breath and wheezing.   Psychiatric/Behavioral: The patient is nervous/anxious.   All other systems reviewed and are negative.    Physical Exam Triage Vital Signs ED Triage Vitals  Enc Vitals Group     BP 09/11/16 1459 126/68     Pulse Rate 09/11/16 1459 96     Resp 09/11/16 1459 18     Temp 09/11/16 1459 98.2 F (36.8 C)     Temp Source 09/11/16 1459 Oral     SpO2 09/11/16 1459 99 %     Weight --      Height --      Head Circumference --      Peak Flow --      Pain Score 09/11/16 1458 0     Pain Loc --      Pain Edu? --      Excl. in GC? --    No data found.   Updated Vital Signs BP 126/68 (BP  Location: Right Arm)   Pulse 96   Temp 98.2 F (36.8 C) (Oral)   Resp 18   SpO2 99%    Physical Exam  Constitutional: She is oriented to person, place, and time. She appears well-developed and well-nourished. No distress.  HENT:  Right Ear: External ear normal.  Left Ear: External ear normal.  Mouth/Throat: Oropharynx is clear and moist.  Eyes: Conjunctivae and EOM are normal. Pupils are equal, round, and reactive to light.  Neck: Normal range of motion. Neck supple.  Pulmonary/Chest: Effort normal.  Musculoskeletal: Normal range of motion.  Neurological: She is alert and oriented to person, place, and time.  Skin: Skin is warm and dry.  Nursing note and vitals reviewed.    UC Treatments / Results  Labs (all labs ordered are listed, but only abnormal results are displayed) Labs Reviewed - No data to display  EKG  EKG Interpretation None       Radiology No results found.  Procedures Procedures (including critical care time)  Medications Ordered in UC Medications - No data to display   Initial Impression / Assessment and Plan / UC Course  I have reviewed the triage  vital signs and the nursing notes.  Pertinent labs & imaging results that were available during my care of the patient were reviewed by me and considered in my medical decision making (see chart for details).     Final Clinical Impressions(s) / UC Diagnoses   Final diagnoses:  Reactive airways dysfunction syndrome (HCC)  Panic attack    New Prescriptions New Prescriptions   ALBUTEROL (PROVENTIL HFA;VENTOLIN HFA) 108 (90 BASE) MCG/ACT INHALER    Inhale 2 puffs into the lungs every 4 (four) hours as needed for wheezing or shortness of breath (cough, shortness of breath or wheezing.).   ALPRAZOLAM (XANAX) 0.25 MG TABLET    Take 1 tablet (0.25 mg total) by mouth daily as needed for anxiety.     [name], MD 09/11/16 [phone]

## 2017-01-24 ENCOUNTER — Encounter (HOSPITAL_COMMUNITY): Payer: Self-pay | Admitting: *Deleted

## 2017-01-24 ENCOUNTER — Emergency Department (HOSPITAL_COMMUNITY): Payer: 59

## 2017-01-24 ENCOUNTER — Emergency Department (HOSPITAL_COMMUNITY)
Admission: EM | Admit: 2017-01-24 | Discharge: 2017-01-24 | Disposition: A | Discharge status: Home / Self Care | Payer: 59 | Attending: Emergency Medicine | Admitting: Emergency Medicine

## 2017-01-24 DIAGNOSIS — Z79899 Other long term (current) drug therapy: Secondary | ICD-10-CM | POA: Insufficient documentation

## 2017-01-24 DIAGNOSIS — R0789 Other chest pain: Secondary | ICD-10-CM | POA: Insufficient documentation

## 2017-01-24 LAB — BASIC METABOLIC PANEL
ANION GAP: 9 (ref 5–15)
BUN: 5 mg/dL — ABNORMAL LOW (ref 6–20)
CHLORIDE: 108 mmol/L (ref 101–111)
CO2: 24 mmol/L (ref 22–32)
Calcium: 9.2 mg/dL (ref 8.9–10.3)
Creatinine, Ser: 0.67 mg/dL (ref 0.44–1.00)
GFR calc Af Amer: >60 mL/min (ref >60)
GLUCOSE: 93 mg/dL (ref 65–99)
POTASSIUM: 3.1 mmol/L — AB (ref 3.5–5.1)
Sodium: 141 mmol/L (ref 135–145)

## 2017-01-24 LAB — CBC
HEMATOCRIT: 37.4 % (ref 36.0–46.0)
HEMOGLOBIN: 12.7 g/dL (ref 12.0–15.0)
MCH: 31.5 pg (ref 26.0–34.0)
MCHC: 34 g/dL (ref 30.0–36.0)
MCV: 92.8 fL (ref 78.0–100.0)
Platelets: 276 10*3/uL (ref 150–400)
RBC: 4.03 MIL/uL (ref 3.87–5.11)
RDW: 13.5 % (ref 11.5–15.5)
WBC: 7.2 10*3/uL (ref 4.0–10.5)

## 2017-01-24 LAB — I-STAT BETA HCG BLOOD, ED (MC, WL, AP ONLY): I-stat hCG, quantitative: <5 m[IU]/mL (ref <5)

## 2017-01-24 LAB — I-STAT TROPONIN, ED
Troponin i, poc: 0 ng/mL (ref 0.00–0.08)
Troponin i, poc: 0 ng/mL (ref 0.00–0.08)

## 2017-01-24 MED ORDER — IBUPROFEN 800 MG PO TABS: 800.0000 mg | ORAL_TABLET | Freq: Three times a day (TID) | ORAL | 0 refills | Status: DC | PRN

## 2017-01-24 NOTE — ED Provider Notes (Signed)
[facility]   CSN: [id number] Arrival date & time: 01/24/17  1200     History   Chief Complaint Chief Complaint  Patient presents with  . Chest Pain    HPI Mackenzie Garcia is a 31 y.o. female.  HPI Patient presents to the emergency department withchest pain that start while she was at work in a meeting.  The patient states during that timeframe.  She also had tingling in her legs, feet and hands.  Patient states that this seemed different than her panic attacks.  Patient states that the pain did not radiate.  He states that she did not take any medications prior to arrival other than what was given to her by EMS.  She states that she has not had any pain since being in the emergency departmentThe patient denies  shortness of breath, headache,blurred vision, neck pain, fever, cough, weakness, numbness, dizziness, anorexia, edema, abdominal pain, nausea, vomiting, diarrhea, rash, back pain, dysuria, hematemesis, bloody stool, near syncope, or syncope. Past Medical History:  Diagnosis Date  . Anxiety   . Chlamydia   . Depression   . Vaginal Pap smear, abnormal     There are no active problems to display for this patient.   Past Surgical History:  Procedure Laterality Date  . LEEP    . NO PAST SURGERIES      OB History    Gravida Para Term Preterm AB Living   2 2 2     2    SAB TAB Ectopic Multiple Live Births                   Home Medications    Prior to Admission medications   Medication Sig Start Date End Date Taking? Authorizing Provider  hydrOXYzine (VISTARIL) 25 MG capsule Take 25 mg by mouth 3 (three) times daily as needed for anxiety.   Yes [provider]  medroxyPROGESTERone (DEPO-PROVERA) 150 MG/ML injection Inject 150 mg into the muscle every 3 (three) months.   Yes [provider]  albuterol (PROVENTIL HFA;VENTOLIN HFA) 108 (90 Base) MCG/ACT inhaler Inhale 2 puffs into the lungs every 4 (four) hours as needed for  wheezing or shortness of breath (cough, shortness of breath or wheezing.). 09/11/16   [name], MD  ALPRAZolam [name]) 0.25 MG tablet Take 1 tablet (0.25 mg total) by mouth daily as needed for anxiety. Patient not taking: Reported on 01/24/2017 09/11/16   [name], MD    Family History Family History  Problem Relation Age of Onset  . Diabetes Father   . Diabetes Maternal Uncle   . Diabetes Maternal Grandmother     Social History Social History  Substance Use Topics  . Smoking status: Never Smoker  . Smokeless tobacco: Never Used  . Alcohol use No     Allergies   Banana   Review of Systems Review of Systems All other systems negative except as documented in the HPI. All pertinent positives and negatives as reviewed in the HPI.  Physical Exam Updated Vital Signs BP 103/72   Pulse 82   Temp 98.1 F (36.7 C) (Oral)   Resp 17   SpO2 100%   Physical Exam  Constitutional: She is oriented to person, place, and time. She appears well-developed and well-nourished. No distress.  HENT:  Head: Normocephalic and atraumatic.  Mouth/Throat: Oropharynx is clear and moist.  Eyes: Pupils are equal, round, and reactive to light.  Neck: Normal range of motion. Neck supple.  Cardiovascular:  Normal rate, regular rhythm and normal heart sounds.  Exam reveals no gallop and no friction rub.   No murmur heard. Pulmonary/Chest: Effort normal and breath sounds normal. No respiratory distress. She has no wheezes.  Abdominal: Soft. Bowel sounds are normal. She exhibits no distension. There is no tenderness.  Neurological: She is alert and oriented to person, place, and time. She exhibits normal muscle tone. Coordination normal.  Skin: Skin is warm and dry. Capillary refill takes less than 2 seconds. No rash noted. No erythema.  Psychiatric: She has a normal mood and affect. Her behavior is normal.  Nursing note and vitals reviewed.    ED Treatments / Results  Labs (all labs  ordered are listed, but only abnormal results are displayed) Labs Reviewed  BASIC METABOLIC PANEL - Abnormal; Notable for the following:       Result Value   Potassium 3.1 (*)    BUN 5 (*)    All other components within normal limits  CBC  I-STAT TROPONIN, ED  I-STAT BETA HCG BLOOD, ED ([facility], [facility], [facility] ONLY)  I-STAT TROPONIN, ED    EKG  EKG Interpretation None       Radiology Dg Chest 2 View  Result Date: 01/24/2017 CLINICAL DATA:  Chest pain. EXAM: CHEST  2 VIEW COMPARISON:  Radiographs of March 27, 2012. FINDINGS: The heart size and mediastinal contours are within normal limits. Both lungs are clear. No pneumothorax or pleural effusion is noted. The visualized skeletal structures are unremarkable. IMPRESSION: No active cardiopulmonary disease. Electronically Signed   By: Lupita RaiderJames  Green Jr, M.D.   On: 01/24/2017 12:49    Procedures Procedures (including critical care time)  Medications Ordered in ED Medications - No data to display   Initial Impression / Assessment and Plan / ED Course  I have reviewed the triage vital signs and the nursing notes.  Pertinent labs & imaging results that were available during my care of the patient were reviewed by me and considered in my medical decision making (see chart for details).     Patient has atypical symptoms for cardiac type chest pain.  The sensation of tingling in her legs, feet and hands.  The patient will be referred back to her primary doctor. Patient had 2 sets of negative troponins.  She has a low heart score as well  Final Clinical Impressions(s) / ED Diagnoses   Final diagnoses:  None    New Prescriptions New Prescriptions   No medications on file     Mackenzie MangesLawyer, Mackenzie Holycross, PA-C 01/24/17 [name]1952    Garcia, Nathan, MD 01/25/17 [phone]

## 2017-01-24 NOTE — Discharge Instructions (Signed)
Your heart enzyme tests and EKGs were normal.  Return here as needed.  Follow-up with your primary doctor

## 2017-01-24 NOTE — ED Notes (Signed)
Pt reports sudden onset stabbing CP while in a meeting today. Pt with no cardiac hx. Denies long trips, recent procedures. Pt endorses using Depo for birth control. Reports pain is worse with inspiration.

## 2017-01-24 NOTE — ED Triage Notes (Signed)
Pt arrived by [facility] for chest pain that started while at work. Pt took asa when pain started and was given 4 nitro pta. Reported that nitro helped improve her pain. Pain increases with certain positions and reported a cough yesterday. No acute distress is noted at triage.

## 2017-02-12 ENCOUNTER — Ambulatory Visit (HOSPITAL_COMMUNITY)
Admission: EM | Admit: 2017-02-12 | Discharge: 2017-02-12 | Disposition: A | Discharge status: Home / Self Care | Payer: Managed Care, Other (non HMO) | Attending: Family Medicine | Admitting: Family Medicine

## 2017-02-12 ENCOUNTER — Encounter (HOSPITAL_COMMUNITY): Payer: Self-pay | Admitting: Emergency Medicine

## 2017-02-12 DIAGNOSIS — H9202 Otalgia, left ear: Secondary | ICD-10-CM | POA: Insufficient documentation

## 2017-02-12 DIAGNOSIS — J029 Acute pharyngitis, unspecified: Secondary | ICD-10-CM | POA: Diagnosis not present

## 2017-02-12 LAB — POCT RAPID STREP A: STREPTOCOCCUS, GROUP A SCREEN (DIRECT): NEGATIVE

## 2017-02-12 NOTE — ED Triage Notes (Signed)
The patient presented to the [facility] with a complaint of left ear pain x 5 days and a sore throat that started today.

## 2017-02-12 NOTE — Discharge Instructions (Signed)
Your rapid strep test was negative. We will send a throat culture to confirm and let you know when the results are available.

## 2017-02-15 LAB — CULTURE, GROUP A STREP (THRC)

## 2017-02-15 NOTE — ED Provider Notes (Signed)
  [facility]   [id number] 02/12/17 Arrival Time: 1808  ASSESSMENT & PLAN:  1. Sore throat   2. Left ear pain    Rapid Strep: Negative. Culture sent. OTC analgesics and throat care as needed.  Reviewed expectations re: course of current medical issues. Questions answered. Outlined signs and symptoms indicating need for more acute intervention. Patient verbalized understanding. After Visit Summary given.   SUBJECTIVE:  Mackenzie Garcia is a 31 y.o. female who reports a sore throat and L ear otalgia. Abrupt onset of symptoms approx 4-5 days ago. Fatigued. No cough or respiratory symptoms. No OTC treatment. Tolerating PO intake but painful with swallowing. No fevers reported.  ROS: As per HPI.   OBJECTIVE:  Vitals:   02/12/17 1841  BP: 118/75  Pulse: 82  Resp: 18  Temp: 98.9 F (37.2 C)  TempSrc: Oral  SpO2: 97%     General appearance: alert; no distress HEENT: mild erythema with mild tonsil enlargement Neck: supple with FROM; cervical LAD, tender Lungs: clear to auscultation bilaterally Skin: reveals no rash; warm and dry Psychological: alert and cooperative; normal mood and affect  Results for orders placed or performed during the hospital encounter of 02/12/17  Culture, group A strep  Result Value Ref Range   Specimen Description THROAT    Special Requests NONE    Culture CULTURE REINCUBATED FOR BETTER GROWTH    Report Status PENDING   POCT rapid strep A [facility])  Result Value Ref Range   Streptococcus, Group A Screen (Direct) NEGATIVE NEGATIVE    Labs Reviewed  CULTURE, GROUP A STREP [facility])  POCT RAPID STREP A    No results found.  Allergies  Allergen Reactions  . Banana Itching    Mouth and throat itch    Past Medical History:  Diagnosis Date  . Anxiety   . Chlamydia   . Depression   . Vaginal Pap smear, abnormal    Social History   Social History  . Marital status: Single    Spouse name: N/A  . Number of children:  N/A  . Years of education: N/A   Occupational History  . Not on file.   Social History Main Topics  . Smoking status: Never Smoker  . Smokeless tobacco: Never Used  . Alcohol use No  . Drug use: No  . Sexual activity: Yes    Birth control/ protection: Injection   Other Topics Concern  . Not on file   Social History Narrative  . No narrative on file   Family History  Problem Relation Age of Onset  . Diabetes Father   . Diabetes Maternal Uncle   . Diabetes Maternal Dulce Sellar, MD 02/15/17 [id number]

## 2017-07-17 ENCOUNTER — Other Ambulatory Visit: Payer: Self-pay

## 2017-07-17 ENCOUNTER — Encounter: Payer: Self-pay | Admitting: Internal Medicine

## 2017-07-17 ENCOUNTER — Ambulatory Visit (INDEPENDENT_AMBULATORY_CARE_PROVIDER_SITE_OTHER): Payer: Managed Care, Other (non HMO) | Admitting: Internal Medicine

## 2017-07-17 VITALS — BP 129/70 | HR 88 | Temp 98.2°F | Ht 65.5 in | Wt 151.0 lb

## 2017-07-17 DIAGNOSIS — Z8709 Personal history of other diseases of the respiratory system: Secondary | ICD-10-CM | POA: Diagnosis not present

## 2017-07-17 DIAGNOSIS — H8111 Benign paroxysmal vertigo, right ear: Secondary | ICD-10-CM | POA: Insufficient documentation

## 2017-07-17 NOTE — Patient Instructions (Signed)
Thank you for allowing us to provide your care today. Please take it slow the next day or so. If the dizziness returns or get worse please give our office a call and we will get a referral sent to vestibular rehab.   How to Perform the Epley Maneuver The Epley maneuver is an exercise that relieves symptoms of vertigo. Vertigo is the feeling that you or your surroundings are moving when they are not. When you feel vertigo, you may feel like the room is spinning and have trouble walking. Dizziness is a little different than vertigo. When you are dizzy, you may feel unsteady or light-headed. You can do this maneuver at home whenever you have symptoms of vertigo. You can do it up to 3 times a day until your symptoms go away. Even though the Epley maneuver may relieve your vertigo for a few weeks, it is possible that your symptoms will return. This maneuver relieves vertigo, but it does not relieve dizziness. What are the risks? If it is done correctly, the Epley maneuver is considered safe. Sometimes it can lead to dizziness or nausea that goes away after a short time. If you develop other symptoms, such as changes in vision, weakness, or numbness, stop doing the maneuver and call your health care provider. How to perform the Epley maneuver 1. Sit on the edge of a bed or table with your back straight and your legs extended or hanging over the edge of the bed or table. 2. Turn your head halfway toward the affected ear or side. 3. Lie backward quickly with your head turned until you are lying flat on your back. You may want to position a pillow under your shoulders. 4. Hold this position for 30 seconds. You may experience an attack of vertigo. This is normal. 5. Turn your head to the opposite direction until your unaffected ear is facing the floor. 6. Hold this position for 30 seconds. You may experience an attack of vertigo. This is normal. Hold this position until the vertigo stops. 7. Turn your whole  body to the same side as your head. Hold for another 30 seconds. 8. Sit back up. You can repeat this exercise up to 3 times a day. Follow these instructions at home:  After doing the Epley maneuver, you can return to your normal activities.  Ask your health care provider if there is anything you should do at home to prevent vertigo. He or she may recommend that you: ? Keep your head raised (elevated) with two or more pillows while you sleep. ? Do not sleep on the side of your affected ear. ? Get up slowly from bed. ? Avoid sudden movements during the day. ? Avoid extreme head movement, like looking up or bending over. Contact a health care provider if:  Your vertigo gets worse.  You have other symptoms, including: ? Nausea. ? Vomiting. ? Headache. Get help right away if:  You have vision changes.  You have a severe or worsening headache or neck pain.  You cannot stop vomiting.  You have new numbness or weakness in any part of your body. Summary  Vertigo is the feeling that you or your surroundings are moving when they are not.  The Epley maneuver is an exercise that relieves symptoms of vertigo.  If the Epley maneuver is done correctly, it is considered safe. You can do it up to 3 times a day. This information is not intended to replace advice given to you by your  health care provider. Make sure you discuss any questions you have with your health care provider. Document Released: 05/18/2013 Document Revised: 04/02/2016 Document Reviewed: 04/02/2016 Elsevier Interactive Patient Education  2017 [employer].

## 2017-07-17 NOTE — Assessment & Plan Note (Signed)
Patient's presentation is most consistent with peripheral vertigo and in particular BPPV. Epely maneuver was preformed with resolution of the patient's symptoms. She was given instructions how to preform the maneuver at home. She was given return precautions including worsening symptoms, N/V, visual changes, inability to tolerate PO intake, focal numbness/weakness.   Plan: - Epely maneuver preformed with resolution of symptoms  - Patient will call if symptoms return and we will send referral for vestibular rehab

## 2017-07-17 NOTE — Progress Notes (Signed)
   CC: Dizziness  HPI:  Ms.Mackenzie Garcia is a 32 y.o. female who presented to the clinic with acute onset dizziness. She states that this AM getting out of bed when she sat up she felt as though the room was spinning. This last 1-2 minutes in total she then proceeded with her day. When she got to work she again experienced a sensation of the room spinning when turning her head rapidly. The episode again lasted 1-2 minutes. She then took it easy at work but when she stood up she became so dizzy she fell backwards. A colleague caught her. She did not pass out or hit her head. She has had episodes similar to this in the past with the most recent one being 1 year prior.   She denies headaches, visual changes, new focal weakness/numbness, tinnitus, hearing loss, history of migraine headaches, recent travel. She does states that she was ill with a URI/sinusitis at the beginning of this month but her symptoms have completely resolved.   Family History: Mother: + HTN and DM  Father: + HTN Two sons are healthy   Social: Works at [employer]  Denies excessive use of EtOH Denies illicit drug use  Past Medical History:  Diagnosis Date  . Anxiety   . Chlamydia   . Depression   . Vaginal Pap smear, abnormal    Review of Systems:  12 point ROS preformed. All negative aside from those mentioned in the HPI.   Physical Exam: Vitals:   07/17/17 1353  BP: 129/70  Pulse: 88  Temp: 98.2 F (36.8 C)  TempSrc: Oral  SpO2: 100%  Weight: 151 lb (68.5 kg)  Height: 5' 5.5" (1.664 m)   General: Well nourished female in no acute distress HENT: Normocephalic, atraumatic, moist mucus membranes  Pulm: Good air movement with no wheezing or crackles  CV: RRR, no mumurs, no rubs  Abdomen: Active bowel sounds, soft, non-distended, no tenderness to palpation  Extremities: Pulses palpable in all extremities, no LE edema  Skin: Warm and dry  Neuro: Alert and oriented x 3. Cranial nerves intact bilaterally. No  nystagmus on exam. Gross strength 5/5 in the upper and lower extremities. Sensation to light touch intact in all extremities.   Assessment & Plan:   See Encounters Tab for problem based charting.  Patient discussed with Dr. Criselda PeachesMullen

## 2017-07-21 NOTE — Addendum Note (Signed)
Addended by: Debe CoderMULLEN, Diamante Rubin B on: 07/21/2017 09:39 AM   Modules accepted: Level of Service

## 2017-07-21 NOTE — Progress Notes (Signed)
Internal Medicine Clinic Attending  Case discussed with Dr. Helberg at the time of the visit.  We reviewed the resident's history and exam and pertinent patient test results.  I agree with the assessment, diagnosis, and plan of care documented in the resident's note.    

## 2018-03-10 ENCOUNTER — Ambulatory Visit: Payer: Self-pay | Admitting: Allergy and Immunology

## 2018-03-16 ENCOUNTER — Encounter: Payer: Self-pay | Admitting: *Deleted

## 2018-06-01 ENCOUNTER — Ambulatory Visit: Payer: Managed Care, Other (non HMO) | Admitting: Allergy and Immunology

## 2018-06-09 ENCOUNTER — Encounter: Payer: Self-pay | Admitting: Internal Medicine

## 2018-09-21 ENCOUNTER — Encounter: Payer: Self-pay | Admitting: Allergy

## 2018-09-21 ENCOUNTER — Ambulatory Visit (INDEPENDENT_AMBULATORY_CARE_PROVIDER_SITE_OTHER): Payer: Managed Care, Other (non HMO) | Admitting: Allergy

## 2018-09-21 ENCOUNTER — Other Ambulatory Visit: Payer: Self-pay

## 2018-09-21 VITALS — BP 118/82 | HR 92 | Temp 98.7°F | Resp 16 | Ht 65.5 in | Wt 159.0 lb

## 2018-09-21 DIAGNOSIS — H1013 Acute atopic conjunctivitis, bilateral: Secondary | ICD-10-CM | POA: Diagnosis not present

## 2018-09-21 DIAGNOSIS — T781XXA Other adverse food reactions, not elsewhere classified, initial encounter: Secondary | ICD-10-CM | POA: Insufficient documentation

## 2018-09-21 DIAGNOSIS — T7819XA Other adverse food reactions, not elsewhere classified, initial encounter: Secondary | ICD-10-CM | POA: Insufficient documentation

## 2018-09-21 DIAGNOSIS — T781XXD Other adverse food reactions, not elsewhere classified, subsequent encounter: Secondary | ICD-10-CM

## 2018-09-21 DIAGNOSIS — J3089 Other allergic rhinitis: Secondary | ICD-10-CM | POA: Insufficient documentation

## 2018-09-21 DIAGNOSIS — R062 Wheezing: Secondary | ICD-10-CM | POA: Diagnosis not present

## 2018-09-21 DIAGNOSIS — L2089 Other atopic dermatitis: Secondary | ICD-10-CM | POA: Insufficient documentation

## 2018-09-21 MED ORDER — OLOPATADINE HCL 0.7 % OP SOLN: 1.0000 [drp] | Freq: Every day | OPHTHALMIC | 5 refills | Status: DC | PRN

## 2018-09-21 MED ORDER — ALBUTEROL SULFATE HFA 108 (90 BASE) MCG/ACT IN AERS
2.0000 | INHALATION_SPRAY | RESPIRATORY_TRACT | 1 refills | Status: DC | PRN
Start: 2018-09-21 — End: 2020-10-04

## 2018-09-21 MED ORDER — FLUTICASONE PROPIONATE 50 MCG/ACT NA SUSP: NASAL | 5 refills | Status: DC

## 2018-09-21 MED ORDER — EPINEPHRINE 0.3 MG/0.3ML IJ SOAJ: 0.3000 mg | Freq: Once | INTRAMUSCULAR | 2 refills | Status: AC

## 2018-09-21 NOTE — Assessment & Plan Note (Signed)
Some flares on antecubital fossa. Uses topical steroid cream with good benefit.  May use triamcinolone on the body as needed twice a day for eczema flares. Do not use on the face.  Discussed proper skin care measures.

## 2018-09-21 NOTE — Assessment & Plan Note (Signed)
Reactions to fresh apples, kiwi, bananas and a peanut butter bar. Tolerates peanut butter, processed fruits, soy sauce with no issues. No prior sesame ingestion.  Today's skin testing showed: Positive to soy, sesame, tree nuts.  Avoid tree nuts, sesame, fresh apples, kiwi and bananas. Okay to eat peanut butter and soy sauce as before.   I have prescribed epinephrine injectable and demonstrated proper use. For mild symptoms you can take over the counter antihistamines such as Benadryl and monitor symptoms closely. If symptoms worsen or if you have severe symptoms including breathing issues, throat closure, significant swelling, whole body hives, severe diarrhea and vomiting, lightheadedness then inject epinephrine and seek immediate medical care afterwards.  Food action plan given.

## 2018-09-21 NOTE — Assessment & Plan Note (Signed)
   See assessment and plan as above for allergic rhinitis.  

## 2018-09-21 NOTE — Assessment & Plan Note (Signed)
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 Used Flonase, Pazeo and Claritin with some benefit. Interested in starting allergy injections as her son is on it right now with good benefit.  Today's skin testing showed: Positive to grass, weed, ragweed, trees, dust mite, mold, cockroach and cat.   Discussed environmental control measures.   May use over the counter antihistamines such as Zyrtec (cetirizine), Claritin (loratadine), Allegra (fexofenadine), or Xyzal (levocetirizine) daily as needed.  Start allergy injections.   May use Pazeo 1 drop in each eye daily as needed for itchy/watery eyes.   May use Flonase 1-2 spray daily for nasal congestion.   Had a detailed discussion with patient/family that clinical history is suggestive of allergic rhinitis, and may benefit from allergy immunotherapy (AIT). Discussed in detail regarding the dosing, schedule, side effects (mild to moderate local allergic reaction and rarely systemic allergic reactions including anaphylaxis), and benefits (significant improvement in nasal symptoms, seasonal flares of asthma) of immunotherapy with the patient. There is significant time commitment involved with allergy shots, which includes weekly immunotherapy injections for first 9-12 months and then biweekly to monthly injections for 3-5 years. Consent was signed.   If above medical not effective will add on Singulair as well.

## 2018-09-21 NOTE — Assessment & Plan Note (Signed)
One episode of wheezing at rest for which she used albuterol with good benefit. No prior history of respiratory issues.   Today's spirometry was normal.   May use albuterol rescue inhaler 2 puffs or nebulizer every 4 to 6 hours as needed for shortness of breath, chest tightness, coughing, and wheezing. May use albuterol rescue inhaler 2 puffs 5 to 15 minutes prior to strenuous physical activities. Monitor frequency of use.

## 2018-09-21 NOTE — Assessment & Plan Note (Signed)
   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 This is caused by cross reactivity of pollen with fresh fruits and vegetables, and nuts. Symptoms are usually localized in the form of itching and burning in mouth and throat. Very rarely it can progress to more severe symptoms. Eating foods in cooked or processed forms usually minimizes symptoms. I recommended avoidance of eating the problem foods, especially during the peak season(s). Sometimes, OFAS can induce severe throat swelling or even a systemic reaction; with such instance, I advised them to report to a local ER. A list of common pollens and food cross-reactivities was provided to the patient.  

## 2018-09-21 NOTE — Patient Instructions (Addendum)
Today's skin testing showed: Positive to grass, weed, ragweed, trees, dust mite, mold, cockroach and cat.  Positive to soy, sesame, tree nuts.  Allergic rhinitis:   May use over the counter antihistamines such as Zyrtec (cetirizine), Claritin (loratadine), Allegra (fexofenadine), or Xyzal (levocetirizine) daily as needed.  Start environmental control measures.   Start allergy injections.   May use Pazeo 1 drop in each eye daily as needed for itchy/watery eyes.   May use Flonase 1-2 spray daily for nasal congestion.   Had a detailed discussion with patient/family that clinical history is suggestive of allergic rhinitis, and may benefit from allergy immunotherapy (AIT). Discussed in detail regarding the dosing, schedule, side effects (mild to moderate local allergic reaction and rarely systemic allergic reactions including anaphylaxis), and benefits (significant improvement in nasal symptoms, seasonal flares of asthma) of immunotherapy with the patient. There is significant time commitment involved with allergy shots, which includes weekly immunotherapy injections for first 9-12 months and then biweekly to monthly injections for 3-5 years.   Wheezing:  May use albuterol rescue inhaler 2 puffs or nebulizer every 4 to 6 hours as needed for shortness of breath, chest tightness, coughing, and wheezing. May use albuterol rescue inhaler 2 puffs 5 to 15 minutes prior to strenuous physical activities. Monitor frequency of use.   Food allergies:  Avoid tree nuts, sesame,  fresh apples, kiwi and bananas.  I have prescribed epinephrine injectable and demonstrated proper use. For mild symptoms you can take over the counter antihistamines such as Benadryl and monitor symptoms closely. If symptoms worsen or if you have severe symptoms including breathing issues, throat closure, significant swelling, whole body hives, severe diarrhea and vomiting, lightheadedness then inject epinephrine and seek immediate  medical care afterwards.  Food action plan given.  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 This is caused by cross reactivity of pollen with fresh fruits and vegetables, and nuts. Symptoms are usually localized in the form of itching and burning in mouth and throat. Very rarely it can progress to more severe symptoms. Eating foods in cooked or processed forms usually minimizes symptoms. I recommended avoidance of eating the problem foods, especially during the peak season(s). Sometimes, OFAS can induce severe throat swelling or even a systemic reaction; with such instance, I advised them to report to a local ER. A list of common pollens and food cross-reactivities was provided to the patient.   Eczema:  May use triamcinolone on the body as needed twice a day for eczema flares. Do not use on the face.  Follow up in 3 months  Reducing Pollen Exposure . Pollen seasons: trees (spring), grass (summer) and ragweed/weeds (fall). Marland Kitchen. Keep windows closed in your home and car to lower pollen exposure.  Mackenzie Garcia. Install air conditioning in the bedroom and throughout the house if possible.  . Avoid going out in dry windy days - especially early morning. . Pollen counts are highest between 5 - 10 AM and on dry, hot and windy days.  . Save outside activities for late afternoon or after a heavy rain, when pollen levels are lower.  . Avoid mowing of grass if you have grass pollen allergy. Marland Kitchen. Be aware that pollen can also be transported indoors on people and pets.  . Dry your clothes in an automatic dryer rather than hanging them outside where they might collect pollen.  . Rinse hair and eyes before bedtime.  Control of House Dust Mite Allergen .  Dust mite allergens are a common trigger of allergy and asthma symptoms. While they can be found throughout the house, these microscopic creatures thrive in warm, humid environments such as bedding,  upholstered furniture and carpeting. . Because so much time is spent in the bedroom, it is essential to reduce mite levels there.  . Encase pillows, mattresses, and box springs in special allergen-proof fabric covers or airtight, zippered plastic covers.  . Bedding should be washed weekly in hot water (130 F) and dried in a hot dryer. Allergen-proof covers are available for comforters and pillows that can't be regularly washed.  Reyes Ivan the allergy-proof covers every few months. Minimize clutter in the bedroom. Keep pets out of the bedroom.  Marland Kitchen Keep humidity less than 50% by using a dehumidifier or air conditioning. You can buy a humidity measuring device called a hygrometer to monitor this.  . If possible, replace carpets with hardwood, linoleum, or washable area rugs. If that's not possible, vacuum frequently with a vacuum that has a HEPA filter. . Remove all upholstered furniture and non-washable window drapes from the bedroom. . Remove all non-washable stuffed toys from the bedroom.  Wash stuffed toys weekly. Pet Allergen Avoidance: . Contrary to popular opinion, there are no "hypoallergenic" breeds of dogs or cats. That is because people are not allergic to an animal's hair, but to an allergen found in the animal's saliva, dander (dead skin flakes) or urine. Pet allergy symptoms typically occur within minutes. For some people, symptoms can build up and become most severe 8 to 12 hours after contact with the animal. People with severe allergies can experience reactions in public places if dander has been transported on the pet owners' clothing. Marland Kitchen Keeping an animal outdoors is only a partial solution, since homes with pets in the yard still have higher concentrations of animal allergens. . Before getting a pet, ask your allergist to determine if you are allergic to animals. If your pet is already considered part of your family, try to minimize contact and keep the pet out of the bedroom and other rooms  where you spend a great deal of time. . As with dust mites, vacuum carpets often or replace carpet with a hardwood floor, tile or linoleum. . High-efficiency particulate air (HEPA) cleaners can reduce allergen levels over time. . While dander and saliva are the source of cat and dog allergens, urine is the source of allergens from rabbits, hamsters, mice and [place] pigs; so ask a non-allergic family member to clean the animal's cage. . If you have a pet allergy, talk to your allergist about the potential for allergy immunotherapy (allergy shots). This strategy can often provide long-term relief. Mold Control . Mold and fungi can grow on a variety of surfaces provided certain temperature and moisture conditions exist.  . Outdoor molds grow on plants, decaying vegetation and soil. The major outdoor mold, Alternaria and Cladosporium, are found in very high numbers during hot and dry conditions. Generally, a late summer - fall peak is seen for common outdoor fungal spores. Rain will temporarily lower outdoor mold spore count, but counts rise rapidly when the rainy period ends. . The most important indoor molds are Aspergillus and Penicillium. Dark, humid and poorly ventilated basements are ideal sites for mold growth. The next most common sites of mold growth are the bathroom and the kitchen. Outdoor (Seasonal) Mold Control . Use air conditioning and keep windows closed. . Avoid exposure to decaying vegetation. Marland Kitchen Avoid leaf raking. . Avoid  grain handling. . Consider wearing a face mask if working in moldy areas.  Indoor (Perennial) Mold Control  . Maintain humidity below 50%. . Get rid of mold growth on hard surfaces with water, detergent and, if necessary, 5% bleach (do not mix with other cleaners). Then dry the area completely. If mold covers an area more than 10 square feet, consider hiring an indoor environmental professional. . For clothing, washing with soap and water is best. If moldy items  cannot be cleaned and dried, throw them away. . Remove sources e.g. contaminated carpets. . Repair and seal leaking roofs or pipes. Using dehumidifiers in damp basements may be helpful, but empty the water and clean units regularly to prevent mildew from forming. All rooms, especially basements, bathrooms and kitchens, require ventilation and cleaning to deter mold and mildew growth. Avoid carpeting on concrete or damp floors, and storing items in damp areas. Cockroach Allergen Avoidance Cockroaches are often found in the homes of densely populated urban areas, schools or commercial buildings, but these creatures can lurk almost anywhere. This does not mean that you have a dirty house or living area. . Block all areas where roaches can enter the home. This includes crevices, wall cracks and windows.  . Cockroaches need water to survive, so fix and seal all leaky faucets and pipes. Have an exterminator go through the house when your family and pets are gone to eliminate any remaining roaches. Marland Kitchen Keep food in lidded containers and put pet food dishes away after your pets are done eating. Vacuum and sweep the floor after meals, and take out garbage and recyclables. Use lidded garbage containers in the kitchen. Wash dishes immediately after use and clean under stoves, refrigerators or toasters where crumbs can accumulate. Wipe off the stove and other kitchen surfaces and cupboards regularly.    Skin care recommendations  Bath time: . Always use lukewarm water. AVOID very hot or cold water. Marland Kitchen Keep bathing time to 5-10 minutes. . Do NOT use bubble bath. . Use a mild soap and use just enough to wash the dirty areas. . Do NOT scrub skin vigorously.  . After bathing, pat dry your skin with a towel. Do NOT rub or scrub the skin.  Moisturizers and prescriptions:  . ALWAYS apply moisturizers immediately after bathing (within 3 minutes). This helps to lock-in moisture. . Use the moisturizer several times a  day over the whole body. Peri [place] summer moisturizers include: Aveeno, CeraVe, Cetaphil. Peri [place] winter moisturizers include: Aquaphor, Vaseline, Cerave, Cetaphil, Eucerin, Vanicream. . When using moisturizers along with medications, the moisturizer should be applied about one hour after applying the medication to prevent diluting effect of the medication or moisturize around where you applied the medications. When not using medications, the moisturizer can be continued twice daily as maintenance.  Laundry and clothing: . Avoid laundry products with added color or perfumes. . Use unscented hypo-allergenic laundry products such as Tide free, Cheer free & gentle, and All free and clear.  . If the skin still seems dry or sensitive, you can try double-rinsing the clothes. . Avoid tight or scratchy clothing such as wool. . Do not use fabric softeners or dyer sheets.

## 2018-09-21 NOTE — Progress Notes (Signed)
New Patient Note  RE: Mackenzie Garcia MRN: [id number] DOB: Apr 06, 1986 Date of Office Visit: 09/21/2018  Referring provider: No ref. provider found Primary care provider: Leilani Able, MD  Chief Complaint: Allergic Rhinitis ; Wheezing (used her son's inhaler to help with breathing a couple of weeks ago); and sweet and salty bar (throat itching, peanuts, kiwi)  History of Present Illness: I had the pleasure of seeing Mackenzie Garcia for initial evaluation at the [facility] on 09/21/2018. She is a 33 y.o. female, who is self-referred for the evaluation of allergic rhinitis, wheezing, food allergies.   Rhinitis: She reports symptoms of itchy/swelling eyes, rhinorrhea, sneezing, nasal congestion. Symptoms have been going on for 20+ years. The symptoms are present all year around with worsening in spring and summer. Other triggers include exposure to pollen. Anosmia: no. Headache: sometimes. She has used Flonase, [employer], Claritin with some improvement in symptoms. Sinus infections: once. Previous work up includes: none. Previous ENT evaluation: no.  Wheezing: She reports symptoms of wheezing for 1 episode. Current medications include none. She used her son's albuterol which helped. She was just laying down and talking on the phone when this happened. No other history of respiratory issues. History of pneumonia: no. She was not evaluated by allergist/pulmonologist in the past. Smoking exposure: no. Up to date with flu vaccine: no.   Food: Fresh apples, kiwi, bananas cause perioral pruritus.  She had a salt and peanut butter and started to have throat pruritus.  These symptoms usually lasts for 3 hours and resolve without any medications. She had these issues for about 10+ years. She is Garcia to tolerate processed forms of apples and bananas with no issues.   She does not have access to epinephrine autoinjector.  Past work up includes: none. Dietary History: patient has been  eating other foods including milk, eggs, peanut butter, almonds, shellfish, seafood, soy, wheat, meats, fruits and vegetables. No prior sesame ingestion.  She reports reading labels and avoiding fresh apples, bananas, salt and peanut butter bar in diet completely.   Assessment and Plan: Mackenzie Garcia is a 33 y.o. female with: Other allergic rhinitis Perennial rhino conjunctivitis symptoms for the past 20+ years with worsening in the spring and summer. Used Flonase, Pazeo and Claritin with some benefit. Interested in starting allergy injections as her son is on it right now with good benefit.  Today's skin testing showed: Positive to grass, weed, ragweed, trees, dust mite, mold, cockroach and cat.   Discussed environmental control measures.   May use over the counter antihistamines such as Zyrtec (cetirizine), Claritin (loratadine), Allegra (fexofenadine), or Xyzal (levocetirizine) daily as needed.  Start allergy injections.   May use Pazeo 1 drop in each eye daily as needed for itchy/watery eyes.   May use Flonase 1-2 spray daily for nasal congestion.   Had a detailed discussion with patient/family that clinical history is suggestive of allergic rhinitis, and may benefit from allergy immunotherapy (AIT). Discussed in detail regarding the dosing, schedule, side effects (mild to moderate local allergic reaction and rarely systemic allergic reactions including anaphylaxis), and benefits (significant improvement in nasal symptoms, seasonal flares of asthma) of immunotherapy with the patient. There is significant time commitment involved with allergy shots, which includes weekly immunotherapy injections for first 9-12 months and then biweekly to monthly injections for 3-5 years. Consent was signed.   If above medical not effective will add on Singulair as well.   Allergic conjunctivitis of both eyes  See assessment and  plan as above for allergic rhinitis.   Wheezing One episode of wheezing at rest  for which she used albuterol with good benefit. No prior history of respiratory issues.   Today's spirometry was normal.   May use albuterol rescue inhaler 2 puffs or nebulizer every 4 to 6 hours as needed for shortness of breath, chest tightness, coughing, and wheezing. May use albuterol rescue inhaler 2 puffs 5 to 15 minutes prior to strenuous physical activities. Monitor frequency of use.   Adverse food reaction Reactions to fresh apples, kiwi, bananas and a peanut butter bar. Tolerates peanut butter, processed fruits, soy sauce with no issues. No prior sesame ingestion.  Today's skin testing showed: Positive to soy, sesame, tree nuts.  Avoid tree nuts, sesame, fresh apples, kiwi and bananas. Okay to eat peanut butter and soy sauce as before.   I have prescribed epinephrine injectable and demonstrated proper use. For mild symptoms you can take over the counter antihistamines such as Benadryl and monitor symptoms closely. If symptoms worsen or if you have severe symptoms including breathing issues, throat closure, significant swelling, whole body hives, severe diarrhea and vomiting, lightheadedness then inject epinephrine and seek immediate medical care afterwards.  Food action plan given.  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 This is caused by cross reactivity of pollen with fresh fruits and vegetables, and nuts. Symptoms are usually localized in the form of itching and burning in mouth and throat. Very rarely it can progress to more severe symptoms. Eating foods in cooked or processed forms usually minimizes symptoms. I recommended avoidance of eating the problem foods, especially during the peak season(s). Sometimes, OFAS can induce severe throat swelling or even a systemic reaction; with such instance, I advised them to report to a local ER. A list of common pollens and food cross-reactivities  was provided to the patient.   Other atopic dermatitis Some flares on antecubital fossa. Uses topical steroid cream with good benefit.  May use triamcinolone on the body as needed twice a day for eczema flares. Do not use on the face.  Discussed proper skin care measures.   Return in about 3 months (around 12/21/2018).  Meds ordered this encounter  Medications  . albuterol (PROAIR HFA) 108 (90 Base) MCG/ACT inhaler    Sig: Inhale 2 puffs into the lungs every 4 (four) hours as needed for wheezing or shortness of breath.    Dispense:  1 Inhaler    Refill:  1  . Olopatadine HCl (PAZEO) 0.7 % SOLN    Sig: Place 1 drop into both eyes daily as needed (itchy/watery eyes).    Dispense:  1 Bottle    Refill:  5  . EPINEPHrine (EPIPEN 2-PAK) 0.3 mg/0.3 mL IJ SOAJ injection    Sig: Inject 0.3 mLs (0.3 mg total) into the muscle once for 1 dose.    Dispense:  2 Device    Refill:  2  . fluticasone (FLONASE ALLERGY RELIEF) 50 MCG/ACT nasal spray    Sig: Take 1-2 sprays daily for nasal congestion    Dispense:  16 g    Refill:  5   Other allergy screening: Medication allergy: no Hymenoptera allergy: no Urticaria: yes in the past with grass contact Eczema: yes, uses hydrocortisone and triamcinolone cream as needed.  History of recurrent infections suggestive of immunodeficency: no  Diagnostics: Spirometry:  Tracings reviewed. Her effort: Good reproducible efforts. FVC: 3.28L FEV1: 2.45L,  89% predicted FEV1/FVC ratio: 75% Interpretation: Spirometry consistent with normal pattern.  Please see scanned spirometry results for details.  Skin Testing: Environmental allergy panel and select foods. Positive test to: grass, weed, ragweed, trees, dust mite, mold, cockroach and cat; soy, sesame, tree nuts. Results discussed with patient/family. Airborne Adult Perc - 09/21/18 1454    Time Antigen Placed  1454    Allergen Manufacturer  Waynette ButteryGreer    Location  Back    Number of Test  59    1.  Control-Buffer 50% Glycerol  Negative    2. Control-Histamine 1 mg/ml  3+    3. Albumin saline  Negative    4. [place]  4+    5. [place]  4+    6. Johnson  3+    7. [place] Blue  Negative    8. Meadow Fescue  2+    9. Perennial Rye  4+    10. Sweet Vernal  3+    11. Timothy  4+    12. Cocklebur  2+    13. Burweed [place]er  3+    14. Ragweed, short  4+    15. Ragweed, Giant  3+    16. Plantain,  English  2+    17. Lamb's Quarters  2+    18. Sheep Sorrell  2+    19. Rough Pigweed  2+    20. Marsh Elder, Rough  Negative    21. Mugwort, Common  4+    22. Ash mix  2+    23. Birch mix  3+    24. Beech American  2+    25. Box, Elder  3+    26. Cedar, red  2+    27. Cottonwood, Eastern  3+    28. Elm mix  2+    29. Hickory mix  4+    30. Maple mix  2+    31. Oak, [place] mix  4+    32. Pecan Pollen  4+    33. Pine mix  Negative    34. Sycamore Eastern  2+    35. Walnut, Black Pollen  2+    36. Alternaria alternata  Negative    37. Cladosporium Herbarum  Negative    38. Aspergillus mix  Negative    39. Penicillium mix  Negative    40. Bipolaris sorokiniana (Helminthosporium)  Negative    41. Drechslera spicifera (Curvularia)  Negative    42. Mucor plumbeus  Negative    43. Fusarium moniliforme  Negative    44. Aureobasidium pullulans (pullulara)  Negative    45. Rhizopus oryzae  Negative    46. Botrytis cinera  Negative    47. Epicoccum nigrum  Negative    48. Phoma betae  Negative    49. Candida Albicans  Negative    50. Trichophyton mentagrophytes  Negative    51. Mite, D Farinae  5,000 AU/ml  4+    52. Mite, D Pteronyssinus  5,000 AU/ml  4+    53. Cat Hair 10,000 BAU/ml  Negative    54.  Dog Epithelia  Negative    55. Mixed Feathers  Negative    56. Horse Epithelia  Negative    57. Cockroach, German  Negative    58. Mouse  Negative    59. Tobacco Leaf  Negative     Intradermal - 09/21/18 1530    Time Antigen Placed  1530    Allergen Manufacturer  Waynette ButteryGreer     Location  Arm    Number of Test  8    Control  Negative    Mold 1  Negative    Mold 2  Negative    Mold 3  2+    Mold 4  2+    Cat  2+    Dog  Negative    Cockroach  2+     Food Adult Perc - 09/21/18 1500    1. Peanut  Negative    2. Soybean  --   5x3   3. Wheat  --   +/-   4. Sesame  --   4x3   5. Milk, cow  Negative    6. Egg White, Chicken  Negative    7. Casein  Negative    8. Shellfish Mix  Negative    9. Fish Mix  Negative    10. Cashew  Negative    11. Pecan Food  --   3x2   12. [employer]  --   2x2   13. Almond  --   3x2   14. Hazelnut  Negative    15. [place] nut  Negative    16. Coconut  Negative    17. Pistachio  Negative    57. Banana  Negative    58. Apple  Negative       Past Medical History: Patient Active Problem List   Diagnosis Date Noted  . Other allergic rhinitis 09/21/2018  . Allergic conjunctivitis of both eyes 09/21/2018  . Wheezing 09/21/2018  . 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 Adverse food reaction 09/21/2018  . Other atopic dermatitis 09/21/2018  . BPPV (benign paroxysmal positional vertigo), right 07/17/2017   Past Medical History:  Diagnosis Date  . Anxiety   . Chlamydia   . Depression   . Eczema   . Vaginal Pap smear, abnormal    Past Surgical History: Past Surgical History:  Procedure Laterality Date  . LEEP    . NO PAST SURGERIES     Medication List:  Current Outpatient Medications  Medication Sig Dispense Refill  . albuterol (PROAIR HFA) 108 (90 Base) MCG/ACT inhaler Inhale 2 puffs into the lungs every 4 (four) hours as needed for wheezing or shortness of breath. 1 Inhaler 1  . EPINEPHrine (EPIPEN 2-PAK) 0.3 mg/0.3 mL IJ SOAJ injection Inject 0.3 mLs (0.3 mg total) into the muscle once for 1 dose. 2 Device 2  . fluticasone (FLONASE ALLERGY RELIEF) 50 MCG/ACT nasal spray Take 1-2 sprays daily for nasal congestion 16 g 5  . Olopatadine HCl (PAZEO) 0.7 % SOLN Place 1 drop into both eyes daily as needed (itchy/watery  eyes). 1 Bottle 5   No current facility-administered medications for this visit.    Allergies: Allergies  Allergen Reactions  . Banana Itching    Mouth and throat itch   Social History: Social History   Socioeconomic History  . Marital status: Single    Spouse name: Not on file  . Number of children: Not on file  . Years of education: Not on file  . Highest education level: Not on file  Occupational History  . Not on file  Social Needs  . Financial resource strain: Not on file  . Food insecurity:    Worry: Not on file    Inability: Not on file  . Transportation needs:    Medical: Not on file    Non-medical: Not on file  Tobacco Use  . Smoking status: Never Smoker  . Smokeless tobacco: Never Used  Substance and Sexual Activity  . Alcohol use: No  . Drug use: No  . Sexual activity: Yes    Birth control/protection: Injection  Lifestyle  . Physical activity:    Days per week: Not on file    Minutes per session: Not on file  . Stress: Not on file  Relationships  . Social connections:    Talks on phone: Not on file    Gets together: Not on file    Attends religious service: Not on file    Active member of club or organization: Not on file    Attends meetings of clubs or organizations: Not on file    Relationship status: Not on file  Other Topics Concern  . Not on file  Social History Narrative  . Not on file   Lives in a home. Smoking: denies Occupation: Clinical biochemist rep at [employer] History: Water Damage/mildew in the house: no Engineer, civil (consulting) in the family room: no Carpet in the bedroom: no Heating: electric Cooling: central Pet: yes 1 dog x 3 yrs  Family History: Family History  Problem Relation Age of Onset  . Diabetes Father   . Diabetes Maternal Uncle   . Diabetes Maternal Grandmother   . Healthy Mother   . Healthy Sister   . Healthy Brother   . Healthy Sister    Problem                               Relation Asthma                                    Son  Eczema                                Son  Food allergy                          Son  Allergic rhino conjunctivitis     Son   Review of Systems  Constitutional: Negative for appetite change, chills, fever and unexpected weight change.  HENT: Negative for congestion and rhinorrhea.   Eyes: Negative for itching.  Respiratory: Negative for cough, chest tightness, shortness of breath and wheezing.   Cardiovascular: Negative for chest pain.  Gastrointestinal: Negative for abdominal pain.  Genitourinary: Negative for difficulty urinating.  Skin: Negative for rash.  Allergic/Immunologic: Positive for environmental allergies and food allergies.  Neurological: Negative for headaches.   Objective: BP 118/82 (BP Location: Left Arm, Patient Position: Sitting, Cuff Size: Normal)   Pulse 92   Temp 98.7 F (37.1 C) (Tympanic)   Resp 16   Ht 5' 5.5" (1.664 m)   Wt 159 lb (72.1 kg)   SpO2 97%   BMI 26.06 kg/m  Body mass index is 26.06 kg/m. Physical Exam  Constitutional: She is oriented to person, place, and time. She appears well-developed and well-nourished.  HENT:  Head: Normocephalic and atraumatic.  Right Ear: External ear normal.  Left Ear: External ear normal.  Nose: Nose normal.  Mouth/Throat: Oropharynx is clear and moist.  Eyes: Conjunctivae and EOM are normal.  Neck: Neck supple.  Cardiovascular: Normal rate, regular rhythm and normal heart sounds. Exam reveals no gallop and no friction rub.  No murmur heard. Pulmonary/Chest: Effort normal and breath sounds  normal. She has no wheezes. She has no rales.  Abdominal: Soft.  Neurological: She is alert and oriented to person, place, and time.  Skin: Skin is warm. No rash noted.  Psychiatric: She has a normal mood and affect. Her behavior is normal.  Nursing note and vitals reviewed.  The plan was reviewed with the patient/family, and all questions/concerned were addressed.  It was my pleasure to see  Mackenzie Garcia today and participate in her care. Please feel free to contact me with any questions or concerns.  Sincerely,  Wyline Mood, DO Allergy & Immunology  [facility] office: [phone] [facility] office: [phone]  60 minutes spent face-to-face with more than 50% of the time spent discussing allergic rhino conjunctivitis, wheezing, atopic dermatitis and food allergy/oas.

## 2018-09-22 ENCOUNTER — Telehealth: Payer: Self-pay

## 2018-09-22 NOTE — Progress Notes (Signed)
VIALS EXP 09-22-2019 

## 2018-09-22 NOTE — Telephone Encounter (Signed)
Patient's insurance will not cover Pazeo. May we send in ketotifen, azelastine or Patanol?

## 2018-09-23 DIAGNOSIS — J301 Allergic rhinitis due to pollen: Secondary | ICD-10-CM | POA: Diagnosis not present

## 2018-09-23 MED ORDER — OLOPATADINE HCL 0.1 % OP SOLN: 1.0000 [drp] | Freq: Two times a day (BID) | OPHTHALMIC | 5 refills | Status: DC | PRN

## 2018-09-23 NOTE — Telephone Encounter (Signed)
Sent in patanol 0.1% BID prn.

## 2018-09-23 NOTE — Addendum Note (Signed)
Addended by: Ellamae Sia on: 09/23/2018 08:21 AM   Modules accepted: Orders

## 2018-09-24 DIAGNOSIS — J3089 Other allergic rhinitis: Secondary | ICD-10-CM | POA: Diagnosis not present

## 2018-10-05 ENCOUNTER — Other Ambulatory Visit: Payer: Self-pay

## 2018-10-05 ENCOUNTER — Ambulatory Visit (INDEPENDENT_AMBULATORY_CARE_PROVIDER_SITE_OTHER): Payer: Managed Care, Other (non HMO)

## 2018-10-05 ENCOUNTER — Ambulatory Visit: Payer: Managed Care, Other (non HMO)

## 2018-10-05 DIAGNOSIS — J3089 Other allergic rhinitis: Secondary | ICD-10-CM

## 2018-10-05 MED ORDER — EPINEPHRINE 0.3 MG/0.3ML IJ SOAJ: 0.3000 mg | Freq: Once | INTRAMUSCULAR | 2 refills | Status: AC

## 2018-10-05 NOTE — Progress Notes (Signed)
Immunotherapy   Patient Details  Name: Mackenzie Garcia MRN: [id number] Date of Birth: 31-Mar-1986  10/05/2018  Mackenzie Garcia started injections for  Blue vial 0.05 [id number], and Blue vial 0.05 M-[id number]. Patient waited in exam room for 30 min without any problems or issues.  Following schedule: A  Frequency:1 time per week Epi-Pen:Prescription for Epi-Pen given  Consent signed and patient instructions given.  Luiz Ochoa Celinda Dethlefs 10/05/2018, 3:32 PM

## 2018-10-15 IMAGING — DX DG CHEST 2V
2 series · 2 of 2 positions shown · non-contrast
Comparison: Radiographs March 27, 2012.

CLINICAL DATA: Chest pain.

EXAM:
CHEST  2 VIEW

[chest pa]
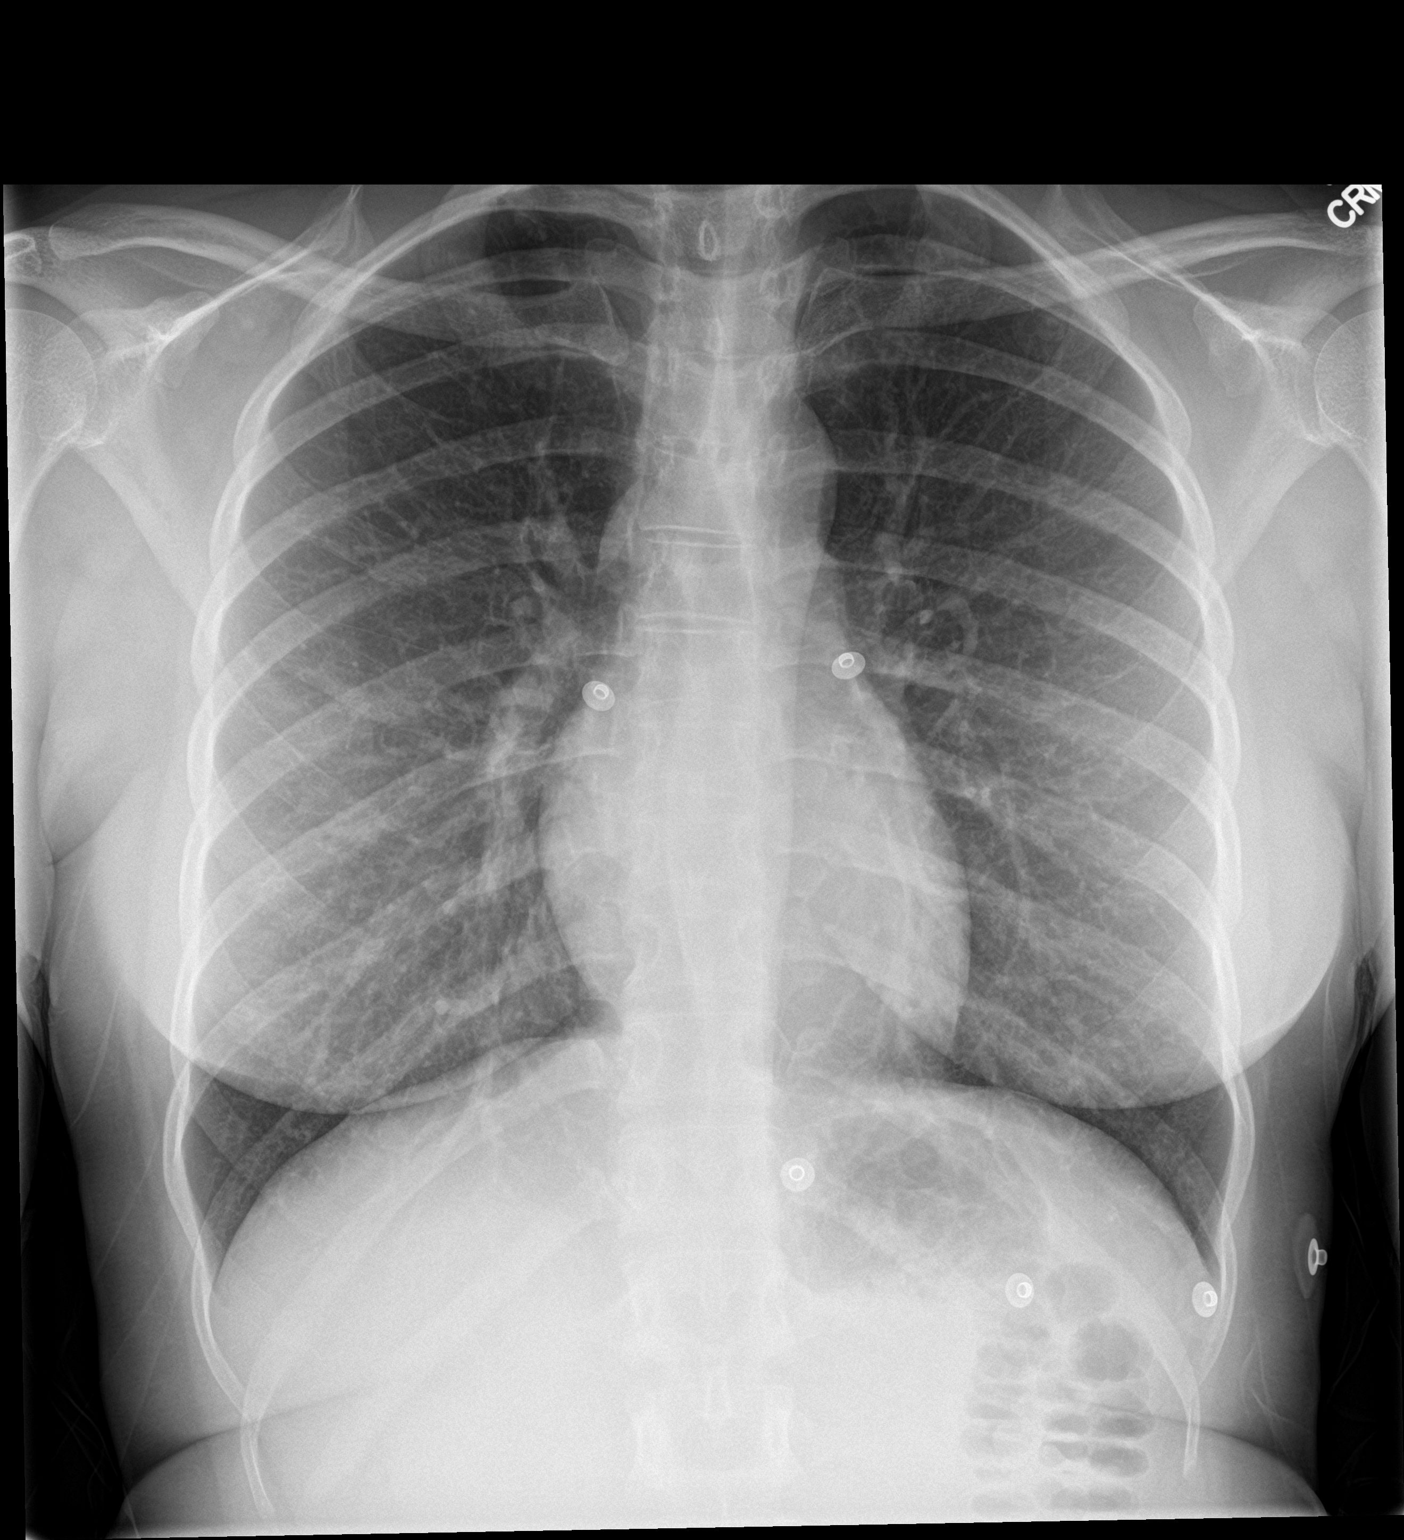

[chest lat]
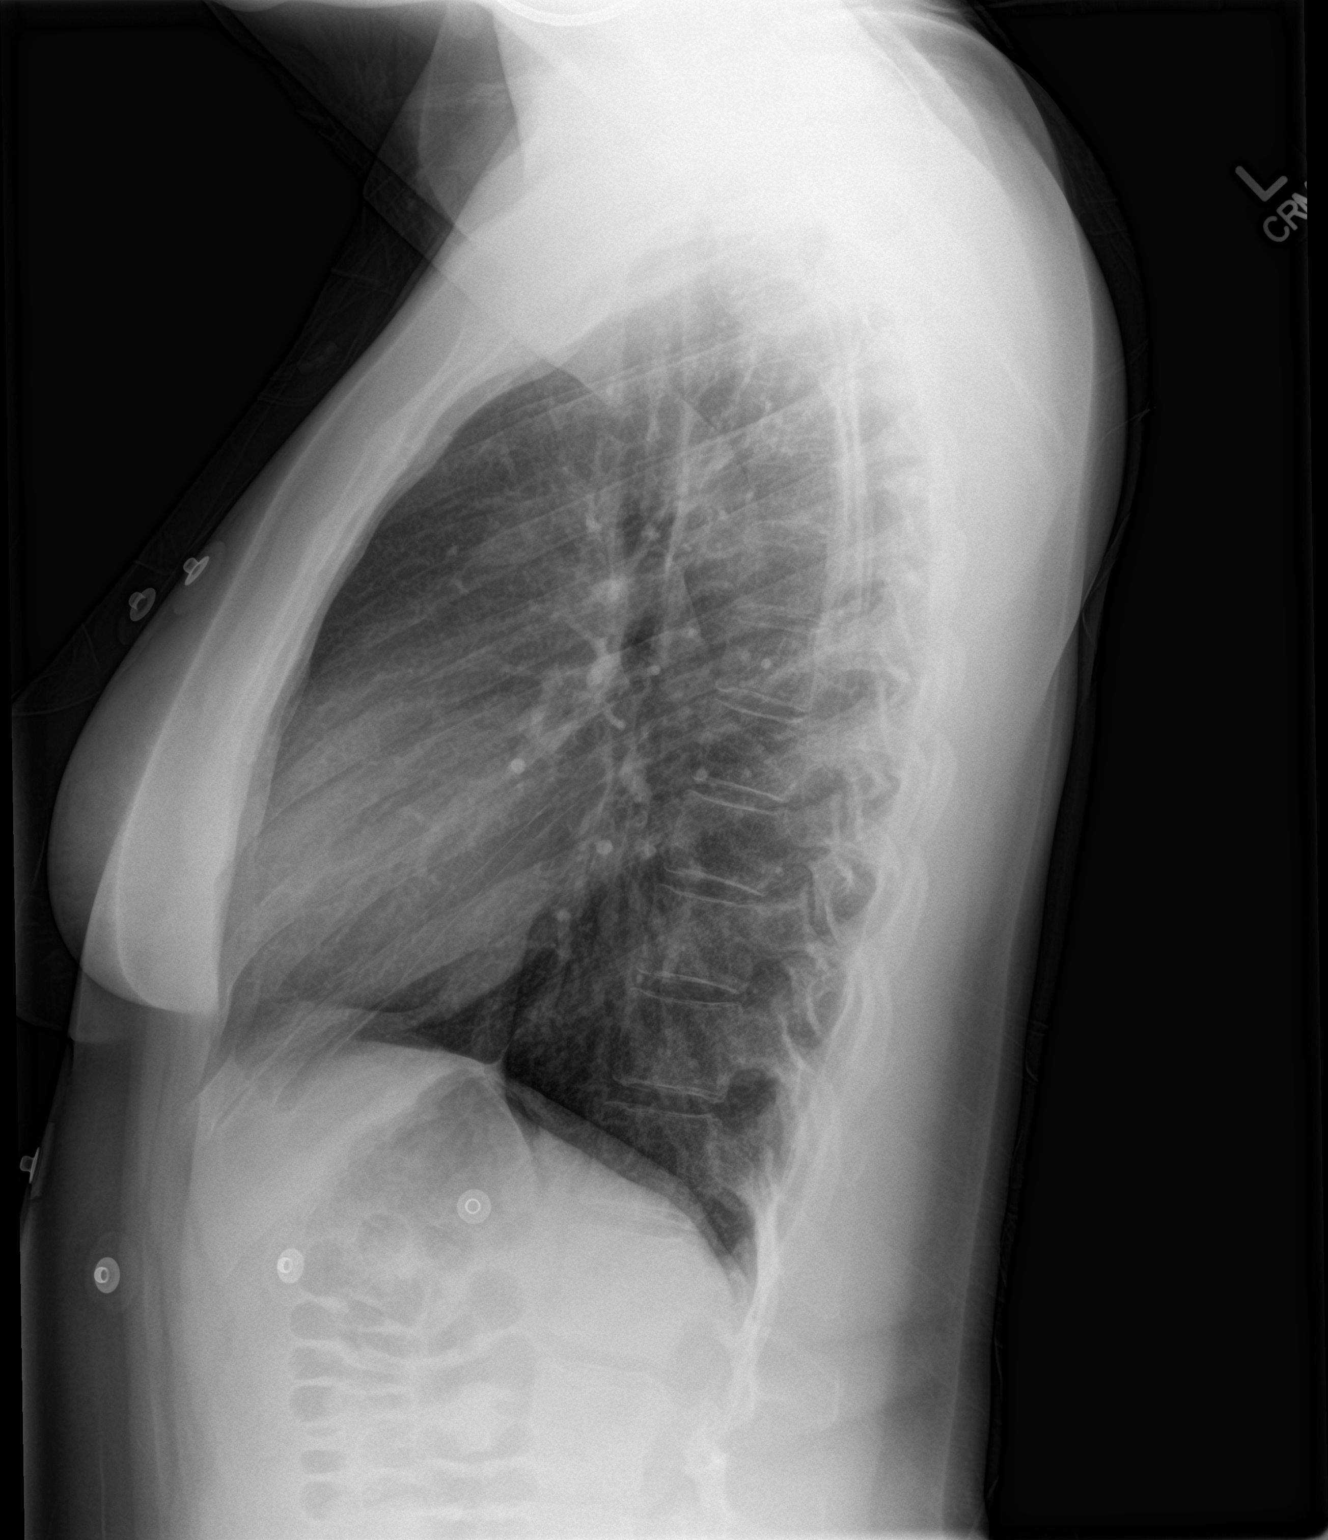

[2 of 2 positions shown; findings below may reference images not displayed]

FINDINGS: The heart size and mediastinal contours are within normal limits.
Both lungs are clear. No pneumothorax or pleural effusion is noted.
The visualized skeletal structures are unremarkable.
IMPRESSION: No active cardiopulmonary disease.

## 2018-10-16 ENCOUNTER — Ambulatory Visit (INDEPENDENT_AMBULATORY_CARE_PROVIDER_SITE_OTHER): Payer: Managed Care, Other (non HMO) | Admitting: *Deleted

## 2018-10-16 DIAGNOSIS — J309 Allergic rhinitis, unspecified: Secondary | ICD-10-CM | POA: Diagnosis not present

## 2018-10-23 ENCOUNTER — Ambulatory Visit: Payer: Self-pay | Admitting: *Deleted

## 2018-10-23 DIAGNOSIS — J309 Allergic rhinitis, unspecified: Secondary | ICD-10-CM

## 2018-11-06 ENCOUNTER — Ambulatory Visit (INDEPENDENT_AMBULATORY_CARE_PROVIDER_SITE_OTHER): Payer: Managed Care, Other (non HMO) | Admitting: *Deleted

## 2018-11-06 DIAGNOSIS — J309 Allergic rhinitis, unspecified: Secondary | ICD-10-CM | POA: Diagnosis not present

## 2018-11-20 ENCOUNTER — Ambulatory Visit (INDEPENDENT_AMBULATORY_CARE_PROVIDER_SITE_OTHER): Payer: Managed Care, Other (non HMO) | Admitting: *Deleted

## 2018-11-20 DIAGNOSIS — J309 Allergic rhinitis, unspecified: Secondary | ICD-10-CM | POA: Diagnosis not present

## 2018-12-21 ENCOUNTER — Ambulatory Visit: Payer: Managed Care, Other (non HMO) | Admitting: Allergy

## 2018-12-21 NOTE — Progress Notes (Deleted)
Follow Up Note  RE: Redmond SchoolLatoya A Garcia MRN: [id number] DOB: 03/24/1986 Date of Office Visit: 12/21/2018  Referring provider: Leilani Ableeese, Betti, MD Primary care provider: Leilani Ableeese, Betti, MD  Chief Complaint: No chief complaint on file.  History of Present Illness: I had the pleasure of seeing Mackenzie Garcia for a follow up visit at the [facility] on 12/21/2018. She is a 33 y.o. female, who is being followed for allergic rhinoconjunctivitis, wheezing, food allergy, oral allergy syndrome, atopic dermatitis. Today she is here for regular follow up visit. Her previous allergy office visit was on 09/21/2018 with Dr. Selena BattenKim.  Started AIT on 10/05/2018 Mackenzie Garcia started injections for  Blue vial 0.05 G-RW-WM-T, and Blue vial 0.05 M-DM-C-CR.  Other allergic rhinitis Perennial rhino conjunctivitis symptoms for the past 20+ years with worsening in the spring and summer. Used Flonase, Pazeo and Claritin with some benefit. Interested in starting allergy injections as her son is on it right now with good benefit.  Today's skin testing showed: Positive to grass, weed, ragweed, trees, dust mite, mold, cockroach and cat.   Discussed environmental control measures.   May use over the counter antihistamines such as Zyrtec (cetirizine), Claritin (loratadine), Allegra (fexofenadine), or Xyzal (levocetirizine) daily as needed.  Start allergy injections.   May use Pazeo 1 drop in each eye daily as needed for itchy/watery eyes.   May use Flonase 1-2 spray daily for nasal congestion.   Had a detailed discussion with patient/family that clinical history is suggestive of allergic rhinitis, and may benefit from allergy immunotherapy (AIT). Discussed in detail regarding the dosing, schedule, side effects (mild to moderate local allergic reaction and rarely systemic allergic reactions including anaphylaxis), and benefits (significant improvement in nasal symptoms, seasonal flares of asthma) of  immunotherapy with the patient. There is significant time commitment involved with allergy shots, which includes weekly immunotherapy injections for first 9-12 months and then biweekly to monthly injections for 3-5 years. Consent was signed.   If above medical not effective will add on Singulair as well.   Allergic conjunctivitis of both eyes  See assessment and plan as above for allergic rhinitis.   Wheezing One episode of wheezing at rest for which she used albuterol with good benefit. No prior history of respiratory issues.   Today's spirometry was normal.   May use albuterol rescue inhaler 2 puffs or nebulizer every 4 to 6 hours as needed for shortness of breath, chest tightness, coughing, and wheezing. May use albuterol rescue inhaler 2 puffs 5 to 15 minutes prior to strenuous physical activities. Monitor frequency of use.   Adverse food reaction Reactions to fresh apples, kiwi, bananas and a peanut butter bar. Tolerates peanut butter, processed fruits, soy sauce with no issues. No prior sesame ingestion.  Today's skin testing showed: Positive to soy, sesame, tree nuts.  Avoid tree nuts, sesame, fresh apples, kiwi and bananas. Okay to eat peanut butter and soy sauce as before.   I have prescribed epinephrine injectable and demonstrated proper use. For mild symptoms you can take over the counter antihistamines such as Benadryl and monitor symptoms closely. If symptoms worsen or if you have severe symptoms including breathing issues, throat closure, significant swelling, whole body hives, severe diarrhea and vomiting, lightheadedness then inject epinephrine and seek immediate medical care afterwards.  Food action plan given.  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 This is caused by cross  reactivity of pollen with fresh fruits and vegetables, and nuts. Symptoms are usually localized in  the form of itching and burning in mouth and throat. Very rarely it can progress to more severe symptoms. Eating foods in cooked or processed forms usually minimizes symptoms. I recommended avoidance of eating the problem foods, especially during the peak season(s). Sometimes, OFAS can induce severe throat swelling or even a systemic reaction; with such instance, I advised them to report to a local ER. A list of common pollens and food cross-reactivities was provided to the patient.   Other atopic dermatitis Some flares on antecubital fossa. Uses topical steroid cream with good benefit.  May use triamcinolone on the body as needed twice a day for eczema flares. Do not use on the face.  Discussed proper skin care measures.   Return in about 3 months (around 12/21/2018).  Assessment and Plan: Mackenzie Garcia is a 33 y.o. female with: No problem-specific Assessment & Plan notes found for this encounter.  No follow-ups on file.  No orders of the defined types were placed in this encounter.  Lab Orders  No laboratory test(s) ordered today    Diagnostics: Spirometry:  Tracings reviewed. Her effort: {Blank single:19197::"Good reproducible efforts.","It was hard to get consistent efforts and there is a question as to whether this reflects a maximal maneuver.","Poor effort, data can not be interpreted."} FVC: ***L FEV1: ***L, ***% predicted FEV1/FVC ratio: ***% Interpretation: {Blank single:19197::"Spirometry consistent with mild obstructive disease","Spirometry consistent with moderate obstructive disease","Spirometry consistent with severe obstructive disease","Spirometry consistent with possible restrictive disease","Spirometry consistent with mixed obstructive and restrictive disease","Spirometry uninterpretable due to technique","Spirometry consistent with normal pattern","No overt abnormalities noted given today's efforts"}.  Please see scanned spirometry results for details.  Skin Testing:  {Blank single:19197::"Select foods","Environmental allergy panel","Environmental allergy panel and select foods","Food allergy panel","None","Deferred due to recent antihistamines use"}. Positive test to: ***. Negative test to: ***.  Results discussed with patient/family.   Medication List:  Current Outpatient Medications  Medication Sig Dispense Refill   albuterol (PROAIR HFA) 108 (90 Base) MCG/ACT inhaler Inhale 2 puffs into the lungs every 4 (four) hours as needed for wheezing or shortness of breath. 1 Inhaler 1   fluticasone (FLONASE ALLERGY RELIEF) 50 MCG/ACT nasal spray Take 1-2 sprays daily for nasal congestion 16 g 5   olopatadine (PATANOL) 0.1 % ophthalmic solution Place 1 drop into both eyes 2 (two) times daily as needed for allergies. 5 mL 5   No current facility-administered medications for this visit.    Allergies: Allergies  Allergen Reactions   Banana Itching    Mouth and throat itch   I reviewed her past medical history, social history, family history, and environmental history and no significant changes have been reported from previous visit on 09/21/2018.  Review of Systems  Constitutional: Negative for appetite change, chills, fever and unexpected weight change.  HENT: Negative for congestion and rhinorrhea.   Eyes: Negative for itching.  Respiratory: Negative for cough, chest tightness, shortness of breath and wheezing.   Cardiovascular: Negative for chest pain.  Gastrointestinal: Negative for abdominal pain.  Genitourinary: Negative for difficulty urinating.  Skin: Negative for rash.  Allergic/Immunologic: Positive for environmental allergies and food allergies.  Neurological: Negative for headaches.   Objective: There were no vitals taken for this visit. There is no height or weight on file to calculate BMI. Physical Exam  Constitutional: She is oriented to person, place, and time. She appears well-developed and well-nourished.  HENT:  Head:  Normocephalic and atraumatic.  Right Ear: External ear normal.  Left Ear: External ear normal.  Nose: Nose normal.  Mouth/Throat: Oropharynx is clear and moist.  Eyes: Conjunctivae and EOM are normal.  Neck: Neck supple.  Cardiovascular: Normal rate, regular rhythm and normal heart sounds. Exam reveals no gallop and no friction rub.  No murmur heard. Pulmonary/Chest: Effort normal and breath sounds normal. She has no wheezes. She has no rales.  Abdominal: Soft.  Neurological: She is alert and oriented to person, place, and time.  Skin: Skin is warm. No rash noted.  Psychiatric: She has a normal mood and affect. Her behavior is normal.  Nursing note and vitals reviewed.  Previous notes and tests were reviewed. The plan was reviewed with the patient/family, and all questions/concerned were addressed.  It was my pleasure to see Mackenzie Garcia today and participate in her care. Please feel free to contact me with any questions or concerns.  Sincerely,  Mackenzie MoodYoon Kathaleya Mcduffee, DO Allergy & Immunology  [facility] [place] office: [phone] [facility] office: [phone] [place] office: [phone]

## 2019-04-21 DIAGNOSIS — R87619 Unspecified abnormal cytological findings in specimens from cervix uteri: Secondary | ICD-10-CM | POA: Insufficient documentation

## 2019-09-28 ENCOUNTER — Telehealth: Payer: Self-pay | Admitting: Allergy

## 2019-09-28 NOTE — Telephone Encounter (Signed)
Please advise 

## 2019-09-28 NOTE — Telephone Encounter (Signed)
Patient called and would like to know what Dr. Selena Batten thinks about her getting the Pfizer COVID vaccine. Patient states she has not have any reactions to past vaccines or Miralax. Patient also states she only has environmental and food allergies.  Patient was informed that since she has not been seen since 08/2018 then she may need to come in for an office visit.  Please advise.

## 2019-09-29 NOTE — Telephone Encounter (Signed)
Called and spoke with patient and advised. Patient verbalized understanding.  ?

## 2019-09-29 NOTE — Telephone Encounter (Signed)
Called and left a voicemail asking for the patient to call back to discuss.  

## 2019-09-29 NOTE — Telephone Encounter (Signed)
Please call patient back.  Environmental and food allergies are not a contraindication for getting covid-19 vaccine.   Given her history, I would recommend that she gets either [employer] or Moderna vaccine.

## 2019-10-29 ENCOUNTER — Emergency Department (HOSPITAL_COMMUNITY)
Admission: EM | Admit: 2019-10-29 | Discharge: 2019-10-30 | Discharge status: Against Medical Advice | Payer: Medicaid Other | Attending: Emergency Medicine | Admitting: Emergency Medicine

## 2019-10-29 ENCOUNTER — Encounter (HOSPITAL_COMMUNITY): Payer: Self-pay | Admitting: Emergency Medicine

## 2019-10-29 ENCOUNTER — Other Ambulatory Visit: Payer: Self-pay

## 2019-10-29 DIAGNOSIS — Z5321 Procedure and treatment not carried out due to patient leaving prior to being seen by health care provider: Secondary | ICD-10-CM | POA: Diagnosis not present

## 2019-10-29 DIAGNOSIS — R1033 Periumbilical pain: Secondary | ICD-10-CM | POA: Insufficient documentation

## 2019-10-29 LAB — COMPREHENSIVE METABOLIC PANEL
ALT: 20 U/L (ref 0–44)
AST: 17 U/L (ref 15–41)
Albumin: 3.9 g/dL (ref 3.5–5.0)
Alkaline Phosphatase: 42 U/L (ref 38–126)
Anion gap: 8 (ref 5–15)
BUN: 8 mg/dL (ref 6–20)
CO2: 25 mmol/L (ref 22–32)
Calcium: 9.1 mg/dL (ref 8.9–10.3)
Chloride: 105 mmol/L (ref 98–111)
Creatinine, Ser: 0.58 mg/dL (ref 0.44–1.00)
GFR calc Af Amer: >60 mL/min (ref >60)
GFR calc non Af Amer: >60 mL/min (ref >60)
Glucose, Bld: 93 mg/dL (ref 70–99)
Potassium: 3.4 mmol/L — ABNORMAL LOW (ref 3.5–5.1)
Sodium: 138 mmol/L (ref 135–145)
Total Bilirubin: 1.3 mg/dL — ABNORMAL HIGH (ref 0.3–1.2)
Total Protein: 7.4 g/dL (ref 6.5–8.1)

## 2019-10-29 LAB — URINALYSIS, ROUTINE W REFLEX MICROSCOPIC
Bilirubin Urine: NEGATIVE
Glucose, UA: NEGATIVE mg/dL
Ketones, ur: NEGATIVE mg/dL
Nitrite: NEGATIVE
Protein, ur: NEGATIVE mg/dL
Specific Gravity, Urine: 1.024 (ref 1.005–1.030)
pH: 5 (ref 5.0–8.0)

## 2019-10-29 LAB — CBC
HCT: 39.2 % (ref 36.0–46.0)
Hemoglobin: 13.1 g/dL (ref 12.0–15.0)
MCH: 32.4 pg (ref 26.0–34.0)
MCHC: 33.4 g/dL (ref 30.0–36.0)
MCV: 97 fL (ref 80.0–100.0)
Platelets: 311 10*3/uL (ref 150–400)
RBC: 4.04 MIL/uL (ref 3.87–5.11)
RDW: 13.6 % (ref 11.5–15.5)
WBC: 8.9 10*3/uL (ref 4.0–10.5)
nRBC: 0 % (ref 0.0–0.2)

## 2019-10-29 LAB — I-STAT BETA HCG BLOOD, ED (MC, WL, AP ONLY): I-stat hCG, quantitative: <5 m[IU]/mL (ref <5)

## 2019-10-29 LAB — LIPASE, BLOOD: Lipase: 36 U/L (ref 11–51)

## 2019-10-29 MED ORDER — SODIUM CHLORIDE 0.9% FLUSH: 3.0000 mL | Freq: Once | INTRAVENOUS | Status: DC

## 2019-10-29 NOTE — ED Notes (Signed)
Was told that pt left 

## 2019-10-29 NOTE — ED Triage Notes (Signed)
Patient arrives to ED with complaints of suprapubic abdominal pain for the past week. Patient states she is unsure if she is pregnant. Patient states that in her last two pregnancies she did not know she was pregnant ether. Patient denies dysuria and states her period is late.

## 2019-10-31 DIAGNOSIS — Z20822 Contact with and (suspected) exposure to covid-19: Secondary | ICD-10-CM | POA: Diagnosis not present

## 2019-10-31 DIAGNOSIS — Z03818 Encounter for observation for suspected exposure to other biological agents ruled out: Secondary | ICD-10-CM | POA: Diagnosis not present

## 2019-11-19 DIAGNOSIS — U071 COVID-19: Secondary | ICD-10-CM | POA: Diagnosis not present

## 2020-04-06 ENCOUNTER — Other Ambulatory Visit: Payer: Self-pay | Admitting: Obstetrics & Gynecology

## 2020-04-06 ENCOUNTER — Encounter: Payer: Self-pay | Admitting: Obstetrics & Gynecology

## 2020-04-06 DIAGNOSIS — N925 Other specified irregular menstruation: Secondary | ICD-10-CM | POA: Diagnosis not present

## 2020-04-06 DIAGNOSIS — O3680X0 Pregnancy with inconclusive fetal viability, not applicable or unspecified: Secondary | ICD-10-CM | POA: Diagnosis not present

## 2020-04-06 DIAGNOSIS — Z01419 Encounter for gynecological examination (general) (routine) without abnormal findings: Secondary | ICD-10-CM | POA: Diagnosis not present

## 2020-04-06 DIAGNOSIS — Z113 Encounter for screening for infections with a predominantly sexual mode of transmission: Secondary | ICD-10-CM | POA: Diagnosis not present

## 2020-04-06 DIAGNOSIS — Z3492 Encounter for supervision of normal pregnancy, unspecified, second trimester: Secondary | ICD-10-CM | POA: Diagnosis not present

## 2020-04-06 DIAGNOSIS — Z124 Encounter for screening for malignant neoplasm of cervix: Secondary | ICD-10-CM | POA: Diagnosis not present

## 2020-04-06 DIAGNOSIS — N760 Acute vaginitis: Secondary | ICD-10-CM | POA: Diagnosis not present

## 2020-04-06 DIAGNOSIS — N898 Other specified noninflammatory disorders of vagina: Secondary | ICD-10-CM | POA: Diagnosis not present

## 2020-04-06 DIAGNOSIS — Z3A09 9 weeks gestation of pregnancy: Secondary | ICD-10-CM | POA: Diagnosis not present

## 2020-04-06 DIAGNOSIS — R110 Nausea: Secondary | ICD-10-CM | POA: Diagnosis not present

## 2020-04-06 DIAGNOSIS — O3680X9 Pregnancy with inconclusive fetal viability, other fetus: Secondary | ICD-10-CM | POA: Diagnosis not present

## 2020-04-10 LAB — OB RESULTS CONSOLE GC/CHLAMYDIA
Chlamydia: NEGATIVE
Gonorrhea: NEGATIVE

## 2020-04-10 LAB — OB RESULTS CONSOLE HIV ANTIBODY (ROUTINE TESTING): HIV: NONREACTIVE

## 2020-04-10 LAB — OB RESULTS CONSOLE RUBELLA ANTIBODY, IGM: Rubella: IMMUNE

## 2020-04-10 LAB — OB RESULTS CONSOLE HEPATITIS B SURFACE ANTIGEN: Hepatitis B Surface Ag: NEGATIVE

## 2020-04-27 ENCOUNTER — Other Ambulatory Visit: Payer: Self-pay | Admitting: Obstetrics & Gynecology

## 2020-05-04 DIAGNOSIS — Z23 Encounter for immunization: Secondary | ICD-10-CM | POA: Diagnosis not present

## 2020-05-04 DIAGNOSIS — O285 Abnormal chromosomal and genetic finding on antenatal screening of mother: Secondary | ICD-10-CM | POA: Insufficient documentation

## 2020-05-04 DIAGNOSIS — R7309 Other abnormal glucose: Secondary | ICD-10-CM | POA: Diagnosis not present

## 2020-05-10 ENCOUNTER — Other Ambulatory Visit: Payer: Self-pay | Admitting: Obstetrics and Gynecology

## 2020-05-10 DIAGNOSIS — O285 Abnormal chromosomal and genetic finding on antenatal screening of mother: Secondary | ICD-10-CM

## 2020-05-17 ENCOUNTER — Other Ambulatory Visit: Payer: Self-pay

## 2020-05-27 NOTE — L&D Delivery Note (Signed)
Delivery Note   Patient Name: Mackenzie Garcia DOB: 14-Feb-1986 MRN: [id number]  Date of admission: 10/24/2020 Delivering MD: Dale McCamey  Date of delivery: 10/24/20 Type of delivery: SVD  Newborn Data: Live born female  Birth Weight:   APGAR: 9, 9  Newborn Delivery   Birth date/time: 10/24/2020 09:54:00 Delivery type: Vaginal, Spontaneous    Mackenzie Garcia, 35 y.o., @ [redacted]w[redacted]d,  [id number], who was admitted for admitted for active spontaneous labor, progress on epidural with AROM at 10 cm. I was called to the room when she progressed 2+ station in the second stage of labor.  She pushed for 45/min.  She delivered a viable infant, cephalic and restituted to the ROA position over an intact perineum.  A nuchal cord   was not identified. The baby was placed on maternal abdomen while initial step of NRP were perfmored (Dry, Stimulated, and warmed). Hat placed on baby for thermoregulation. Delayed cord clamping was performed for 2.15 minutes.  Cord double clamped and cut.  Cord cut by father. Apgar scores were 9 and 9. Prophylactic Pitocin was started in the third stage of labor for active management. The placenta delivered spontaneously, shultz, with a 3 vessel cord and was sent to LD.  Inspection revealed right labial laceration no repair, hemostatic. An examination of the vaginal vault and cervix was free from lacerations. The uterus was firm, bleeding stable.   Placenta and umbilical artery blood gas were not sent.  There were no complications during the procedure.  Mom and baby skin to skin following delivery. Left in stable condition. Performed I&O red cath immediately PP due to did not have foley placed after epidural, relieved yellow urine.   Maternal Info: Anesthesia: Epidural Episiotomy: No Lacerations:  right labial no repiar Suture Repair: no Est. Blood Loss (mL):   Newborn Info:  Baby Sex: female APGAR (1 MIN): 9   APGAR (5 MINS): 9   APGAR (10 MINS):     Mom to  postpartum.  Baby to Couplet care / Skin to Skin.  DR Sallye Ober updated  Dale Cody, CNM, NP-C 10/24/20 10:18 AM

## 2020-06-05 ENCOUNTER — Encounter: Payer: Self-pay | Admitting: *Deleted

## 2020-06-07 ENCOUNTER — Ambulatory Visit (HOSPITAL_BASED_OUTPATIENT_CLINIC_OR_DEPARTMENT_OTHER): Payer: Medicaid Other | Admitting: Genetic Counselor

## 2020-06-07 ENCOUNTER — Other Ambulatory Visit: Payer: Self-pay

## 2020-06-07 ENCOUNTER — Ambulatory Visit: Payer: Medicaid Other | Attending: Obstetrics and Gynecology

## 2020-06-07 ENCOUNTER — Ambulatory Visit: Payer: Medicaid Other | Admitting: *Deleted

## 2020-06-07 ENCOUNTER — Encounter: Payer: Self-pay | Admitting: *Deleted

## 2020-06-07 VITALS — BP 126/70 | HR 93

## 2020-06-07 DIAGNOSIS — Z363 Encounter for antenatal screening for malformations: Secondary | ICD-10-CM | POA: Diagnosis not present

## 2020-06-07 DIAGNOSIS — O285 Abnormal chromosomal and genetic finding on antenatal screening of mother: Secondary | ICD-10-CM

## 2020-06-07 DIAGNOSIS — Z315 Encounter for genetic counseling: Secondary | ICD-10-CM | POA: Diagnosis not present

## 2020-06-07 DIAGNOSIS — Z3A18 18 weeks gestation of pregnancy: Secondary | ICD-10-CM

## 2020-06-07 DIAGNOSIS — O09522 Supervision of elderly multigravida, second trimester: Secondary | ICD-10-CM

## 2020-06-07 NOTE — Progress Notes (Signed)
06/07/2020  Priscilla Kirstein Mcnatt September 09, 1985 MRN: [id number] DOV: 06/07/2020  Ms. Ermis presented to the [facility] for a genetics consultation regarding abnormal noninvasive prenatal screening (NIPS) result. Ms. Beecham was accompanied to her appointment by her partner, Romelle Starcher.   Indication for genetic counseling - Atypical finding on sex chromosomes on NIPS  Prenatal history  Ms. Summons is a [id number], 35 y.o. female. Her current pregnancy has completed [redacted]w[redacted]d (Estimated Date of Delivery: 11/04/20). Ms. Babic has a 36 year old and a 71 year old son from a prior relationship. The current pregnancy is the first together for this couple.  Ms. Enamorado denied exposure to environmental toxins or chemical agents. She denied the use of alcohol, tobacco or street drugs. She reported taking prenatal vitamins. She denied significant viral illnesses, fevers, and bleeding during the course of her pregnancy. Her medical and surgical histories were noncontributory.  Family History  A three generation pedigree was drafted and reviewed. The family history is remarkable for the following:  - Mr. Lawrence's sister has a daughter with autism. The rest of this niece's history is noncontributory, and no one else in the family has learning disabilities or autism. We reviewed that autism can be isolated, multifactorial, or part of a genetic syndrome; however, underlying genetic conditions account for less than 10% of autism diagnoses. Given that Mr. Lawrence's niece is a third-degree relative to the current fetus, the risk of recurrence is likely not greatly elevated above the general population risk of ~1 in 66, or 1.5%.  The remaining family histories were reviewed and found to be noncontributory for birth defects, intellectual disability, recurrent pregnancy loss, and known genetic conditions.    The patient's ancestry is African American. The father of the pregnancy's ancestry is  African American. Both individuals have known [place] ancestry. Ashkenazi Jewish ancestry and consanguinity were denied. Pedigree will be scanned under Media.  Discussion  NIPS result:  Ms. Levert was referred for a genetic counseling consultation as she had [facility] noninvasive prenatal screening (NIPS) through [place] that demonstrated an atypical finding on the sex chromosomes suspected to be placental/fetal in nature. These results also showed a less than 1 in 10,000 risk for trisomies 21, 18 and 13, a low risk for triploidy, and a 1 in 9000 risk for 22q11.2 deletion syndrome. A female fetus was predicted on NIPS.  We reviewed that NIPS analyzes cell-free fetal DNA from the placenta found in the maternal bloodstream during pregnancy. NIPS is used to determine the risk for missing or extra placental chromosomal material for a specific subset of chromosomes. However, given that maternal DNA is also present in a NIPS sample, it is possible that NIPS results can be representative of the mother rather than the pregnancy. Panorama NIPS utilizes technology that is able to distinguish between placental and maternal DNA. Through this, it was suspected that the finding on the sex chromosomes identified in Ms. Phenix's sample is placental/fetal in origin.  We discussed that there are several possibilities that could warrant Ms. Baptists abnormal NIPS result. One possibility is the fetus having a genetic change involving the sex chromosomes. A second possibility is fetal mosaicism for a change involving the sex chromosomes. Mosaicism occurs when not every cell in the body is genetically identical; some cells in the body may have one genetic change involving the sex chromosomes, whereas others may have normal sex chromosomes or sex chromosomes with a different abnormality. A third possibility is that of confined placental mosaicism,  or a result representative of the placenta only rather than the fetus. Finally,  it is possible that this is a false positive result for unclear reasons.   We reviewed that even if the fetus were to have a sex chromosome abnormality, it would not be possible for [place] to predict if this would be a benign change or one that would have implications for health or development based on this NIPS result alone. Ms. Blackard was familiar with monosomy X AKA Turner syndrome as she had done some research independently after receiving her NIPS result. We briefly reviewed features of this condition as well as information about mosaic monosomy X as examples of sex chromosome abnormalities that can have clinical implications. We also reviewed the limitations of NIPS, including the fact that it is not diagnostic and that it does not identify all genetic conditions. Ms. Derossett was counseled that without knowing the precise reason for this abnormal NIPS result, we are limited in understanding if there may be any potentially clinically relevant concerns for the fetus.  Ultrasound:  A complete ultrasound was performed today prior to our visit. The ultrasound report will be sent under separate cover. There were no visualized fetal anomalies or markers suggestive of aneuploidy. We discussed that while some fetuses with a sex chromosome aneuploidy such as monosomy X may demonstrate features of the condition on ultrasound, a normal ultrasound cannot rule out the possibility of a sex chromosome abnormality in a fetus.  Testing options:   We reviewed available testing options to attempt to get more information to clarify the abnormal NIPS finding. Firstly, Ms. Deist was informed that diagnostic testing via amniocentesis would be the only way to determine if the fetus has a chromosomal abnormality involving the sex chromosomes prenatally. We discussed the technical aspects of the procedure and quoted up to a 1 in 500 (0.2%) risk for spontaneous pregnancy loss, preterm labor, or other adverse pregnancy outcomes  as a result of amniocentesis. Cultured cells from an amniocentesis sample allow for the visualization of a fetal karyotype, which can detect >99% of large chromosomal aberrations. Chromosomal microarray can also be performed to identify smaller deletions or duplications of fetal chromosomal material that fall below the resolution of karyotype. Ms. Binning was informed that if a sex chromosome abnormality were identified on amniocentesis, we would then be able to research whether the finding is a benign change or one that could have implications for the fetus's postnatal health or development. Ms. Schremp was also made aware that should amniocentesis not be desired, she can continue with standard ultrasounds to monitor for fetal anomalies and fetal growth. She may wait to pursue genetic testing postnatally if clinically indicated  After careful consideration, Ms. Coryell declined amniocentesis. She informed me that she prefers to wait until the postnatal period to pursue genetic testing for her baby if clinically indicated at that time. She understands that amniocentesis remains an option throughout the remainder of her pregnancy should she change her mind.  Carrier screening:  Per ACOG recommendation, carrier screening for hemoglobinopathies, cystic fibrosis (CF) and spinal muscular atrophy (SMA) was discussed including information about the conditions, rationale for testing, autosomal recessive inheritance, and the option of prenatal diagnosis. Ms. Dake previously had a negative hemoglobin electrophoresis, significantly reducing her chances of being a carrier for a hemoglobinopathy such as sickle cell disease. This also decreases her chances of having a child with a hemoglobinopathy. I offered Ms. Lassalle additional carrier screening for CF and SMA; however, she believed that  she had already had negative carrier screening for these conditions through her OBGYN provider. These reports were not available for  my review. If carrier screening for CF and SMA has not yet been completed and the patient desires testing, I can assist in facilitating this.  Plan:  Additional screening and diagnostic testing were declined today. Ms. Wojnar understands that screening tests, including ultrasound, cannot rule out all birth defects or genetic syndromes. The patient was advised of this limitation and states she still does not want additional testing or screening at this time. She prefers to wait until the postnatal period to pursue additional genetic testing if indicated.   I counseled Ms. Mroz regarding the above risks and available options. The approximate face-to-face time with the genetic counselor was 20 minutes.  In summary:  Reviewed NIPS result  Atypical finding on sex chromosomes of suspected fetal/placental origin  Reduction in risk for Down syndrome, trisomy 37, trisomy 35, triploidy, and 22q11.2 deletion syndrome  Reviewed results of ultrasound  No fetal anomalies or markers seen  Offered additional testing and screening  Declined amniocentesis  Prefers to monitor pregnancy via ultrasound and perform postnatal testing if indicated  Discussed carrier screening for cystic fibrosis, spinal muscular atrophy, and hemoglobinopathies  Negative hemoglobin electrophoresis  Believes she had negative carrier screening for CF & SMA (report not available for my review). If testing has not been completed and patient wishes to have testing, I can help facilitate this  Reviewed family history concerns   Gershon Crane, MS, Aeronautical engineer

## 2020-06-08 ENCOUNTER — Other Ambulatory Visit: Payer: Self-pay | Admitting: *Deleted

## 2020-06-08 DIAGNOSIS — Z362 Encounter for other antenatal screening follow-up: Secondary | ICD-10-CM

## 2020-07-06 ENCOUNTER — Ambulatory Visit: Payer: Medicaid Other | Admitting: *Deleted

## 2020-07-06 ENCOUNTER — Ambulatory Visit: Payer: Medicaid Other | Attending: Obstetrics and Gynecology

## 2020-07-06 ENCOUNTER — Other Ambulatory Visit: Payer: Self-pay | Admitting: *Deleted

## 2020-07-06 ENCOUNTER — Encounter: Payer: Self-pay | Admitting: *Deleted

## 2020-07-06 ENCOUNTER — Other Ambulatory Visit: Payer: Self-pay

## 2020-07-06 VITALS — BP 114/68 | HR 94

## 2020-07-06 DIAGNOSIS — O289 Unspecified abnormal findings on antenatal screening of mother: Secondary | ICD-10-CM

## 2020-07-06 DIAGNOSIS — Z362 Encounter for other antenatal screening follow-up: Secondary | ICD-10-CM | POA: Insufficient documentation

## 2020-07-06 DIAGNOSIS — O280 Abnormal hematological finding on antenatal screening of mother: Secondary | ICD-10-CM

## 2020-07-06 DIAGNOSIS — O09522 Supervision of elderly multigravida, second trimester: Secondary | ICD-10-CM | POA: Diagnosis not present

## 2020-07-06 DIAGNOSIS — Z363 Encounter for antenatal screening for malformations: Secondary | ICD-10-CM

## 2020-07-06 DIAGNOSIS — Z3A22 22 weeks gestation of pregnancy: Secondary | ICD-10-CM | POA: Diagnosis not present

## 2020-08-17 ENCOUNTER — Other Ambulatory Visit: Payer: Self-pay

## 2020-08-17 ENCOUNTER — Ambulatory Visit: Payer: Medicaid Other | Attending: Obstetrics

## 2020-08-17 ENCOUNTER — Encounter: Payer: Self-pay | Admitting: *Deleted

## 2020-08-17 ENCOUNTER — Ambulatory Visit: Payer: Medicaid Other | Admitting: *Deleted

## 2020-08-17 VITALS — BP 123/66 | HR 102

## 2020-08-17 DIAGNOSIS — Z363 Encounter for antenatal screening for malformations: Secondary | ICD-10-CM

## 2020-08-17 DIAGNOSIS — O280 Abnormal hematological finding on antenatal screening of mother: Secondary | ICD-10-CM

## 2020-08-18 ENCOUNTER — Other Ambulatory Visit: Payer: Self-pay | Admitting: *Deleted

## 2020-08-18 DIAGNOSIS — Z369 Encounter for antenatal screening, unspecified: Secondary | ICD-10-CM | POA: Diagnosis not present

## 2020-08-18 DIAGNOSIS — Z331 Pregnant state, incidental: Secondary | ICD-10-CM | POA: Diagnosis not present

## 2020-08-18 DIAGNOSIS — Z23 Encounter for immunization: Secondary | ICD-10-CM | POA: Diagnosis not present

## 2020-08-18 DIAGNOSIS — O09522 Supervision of elderly multigravida, second trimester: Secondary | ICD-10-CM | POA: Diagnosis not present

## 2020-08-18 DIAGNOSIS — O285 Abnormal chromosomal and genetic finding on antenatal screening of mother: Secondary | ICD-10-CM | POA: Diagnosis not present

## 2020-08-18 DIAGNOSIS — O280 Abnormal hematological finding on antenatal screening of mother: Secondary | ICD-10-CM

## 2020-08-25 DIAGNOSIS — R7302 Impaired glucose tolerance (oral): Secondary | ICD-10-CM | POA: Diagnosis not present

## 2020-09-28 ENCOUNTER — Ambulatory Visit: Payer: Medicaid Other | Attending: Obstetrics and Gynecology

## 2020-09-28 ENCOUNTER — Ambulatory Visit: Payer: Medicaid Other | Admitting: *Deleted

## 2020-09-28 ENCOUNTER — Encounter: Payer: Self-pay | Admitting: *Deleted

## 2020-09-28 ENCOUNTER — Other Ambulatory Visit: Payer: Self-pay

## 2020-09-28 VITALS — BP 112/68 | HR 106

## 2020-09-28 DIAGNOSIS — O280 Abnormal hematological finding on antenatal screening of mother: Secondary | ICD-10-CM | POA: Diagnosis not present

## 2020-09-28 DIAGNOSIS — Z3A34 34 weeks gestation of pregnancy: Secondary | ICD-10-CM

## 2020-09-28 DIAGNOSIS — O281 Abnormal biochemical finding on antenatal screening of mother: Secondary | ICD-10-CM | POA: Diagnosis not present

## 2020-09-28 DIAGNOSIS — O09523 Supervision of elderly multigravida, third trimester: Secondary | ICD-10-CM | POA: Insufficient documentation

## 2020-10-04 ENCOUNTER — Inpatient Hospital Stay (HOSPITAL_COMMUNITY)
Admission: AD | Admit: 2020-10-04 | Discharge: 2020-10-04 | Disposition: A | Discharge status: Home / Self Care | Payer: Medicaid Other | Attending: Obstetrics & Gynecology | Admitting: Obstetrics & Gynecology

## 2020-10-04 ENCOUNTER — Other Ambulatory Visit: Payer: Self-pay

## 2020-10-04 ENCOUNTER — Encounter (HOSPITAL_COMMUNITY): Payer: Self-pay | Admitting: Obstetrics & Gynecology

## 2020-10-04 DIAGNOSIS — O99891 Other specified diseases and conditions complicating pregnancy: Secondary | ICD-10-CM

## 2020-10-04 DIAGNOSIS — O09529 Supervision of elderly multigravida, unspecified trimester: Secondary | ICD-10-CM | POA: Insufficient documentation

## 2020-10-04 DIAGNOSIS — R109 Unspecified abdominal pain: Secondary | ICD-10-CM | POA: Diagnosis not present

## 2020-10-04 DIAGNOSIS — S3981XA Other specified injuries of abdomen, initial encounter: Secondary | ICD-10-CM | POA: Diagnosis not present

## 2020-10-04 DIAGNOSIS — W08XXXA Fall from other furniture, initial encounter: Secondary | ICD-10-CM | POA: Diagnosis not present

## 2020-10-04 DIAGNOSIS — O9A213 Injury, poisoning and certain other consequences of external causes complicating pregnancy, third trimester: Secondary | ICD-10-CM | POA: Insufficient documentation

## 2020-10-04 DIAGNOSIS — Z3A35 35 weeks gestation of pregnancy: Secondary | ICD-10-CM

## 2020-10-04 DIAGNOSIS — W01198A Fall on same level from slipping, tripping and stumbling with subsequent striking against other object, initial encounter: Secondary | ICD-10-CM

## 2020-10-04 DIAGNOSIS — Y92003 Bedroom of unspecified non-institutional (private) residence as the place of occurrence of the external cause: Secondary | ICD-10-CM | POA: Insufficient documentation

## 2020-10-04 NOTE — MAU Provider Note (Signed)
History     CSN: [id number]  Arrival date and time: 10/04/20 1547   Event Date/Time   First Provider Initiated Contact with Patient 10/04/20 1653      Chief Complaint  Patient presents with  . Fall   HPI Mackenzie Garcia is a 35 y.o. [id number] at [redacted]w[redacted]d who presents for evaluation after fall. Incident occurred at 230 am this morning. Uses a step stool to get in & out of her bed. When getting back into bed after using the bathroom she fell onto her bed & was hit in the right side/flank by the stool. Since then has been sore on her right side "like she pulled a muscle". Denies contractions, abdominal pain, LOF, vaginal bleeding. Reports good fetal movement.   OB History    Gravida  3   Para  2   Term  2   Preterm      AB      Living  2     SAB      IAB      Ectopic      Multiple      Live Births  2           Past Medical History:  Diagnosis Date  . Anxiety   . Chlamydia   . Depression   . Eczema   . Vaginal Pap smear, abnormal     Past Surgical History:  Procedure Laterality Date  . LEEP      Family History  Problem Relation Age of Onset  . Diabetes Father   . Diabetes Maternal Uncle   . Diabetes Maternal Grandmother   . Healthy Mother   . Healthy Sister   . Healthy Brother   . Healthy Sister     Social History   Tobacco Use  . Smoking status: Never Smoker  . Smokeless tobacco: Never Used  Vaping Use  . Vaping Use: Never used  Substance Use Topics  . Alcohol use: No  . Drug use: No    Allergies:  Allergies  Allergen Reactions  . Banana Itching    Mouth and throat itch  . Soy Allergy Other (See Comments)    Pt is unsure of reaction     Medications Prior to Admission  Medication Sig Dispense Refill Last Dose  . Prenatal Vit-Fe Fumarate-FA (PRENATAL MULTIVITAMIN) TABS tablet Take 1 tablet by mouth daily at 12 noon.   10/03/2020 at Unknown time  . albuterol (PROAIR HFA) 108 (90 Base) MCG/ACT inhaler Inhale 2 puffs into the lungs  every 4 (four) hours as needed for wheezing or shortness of breath. (Patient not taking: Reported on 06/07/2020) 1 Inhaler 1   . fluticasone (FLONASE ALLERGY RELIEF) 50 MCG/ACT nasal spray Take 1-2 sprays daily for nasal congestion (Patient not taking: Reported on 06/07/2020) 16 g 5   . metoCLOPramide (REGLAN) 10 MG tablet metoclopramide 10 mg tablet  Take 1 tablet 4 times a day by oral route.     Marland Kitchen olopatadine (PATANOL) 0.1 % ophthalmic solution Place 1 drop into both eyes 2 (two) times daily as needed for allergies. (Patient not taking: Reported on 09/28/2020) 5 mL 5   . triamcinolone ointment (KENALOG) 0.1 % triamcinolone acetonide 0.1 % topical ointment  APPLY A THIN LAYER TO THE AFFECTED AREA(S) BY TOPICAL ROUTE 2 TIMES PER DAY       Review of Systems  Constitutional: Negative.   Gastrointestinal: Negative.   Genitourinary: Negative.    Physical Exam   Blood pressure 124/75,  pulse (!) 101, temperature 98.4 F (36.9 C), temperature source Oral, resp. rate 12, last menstrual period 02/06/2020, SpO2 99 %.  Physical Exam Vitals and nursing note reviewed.  Constitutional:      General: She is not in acute distress.    Appearance: Normal appearance. She is normal weight.  HENT:     Head: Normocephalic and atraumatic.  Eyes:     General: No scleral icterus. Pulmonary:     Effort: Pulmonary effort is normal. No respiratory distress.  Abdominal:     Tenderness: There is no abdominal tenderness.     Comments: No bruising  Skin:    General: Skin is warm and dry.  Neurological:     Mental Status: She is alert.  Psychiatric:        Mood and Affect: Mood normal.        Behavior: Behavior normal.    NST:  Baseline: 145 bpm, Variability: Good {> 6 bpm), Accelerations: Reactive and Decelerations: Absent  MAU Course  Procedures No results found for this or any previous visit (from the past 24 hour(s)).  MDM Patient reports hitting her right side around 230 am this morning. Feels like  she pulled a muscle on her right side that is only painful when she leans to her left side. Denies abdominal pain. Abdomen soft & non tender, no bruising.  Some contractions on monitor. She denies feeling any contractions - likely normal for gestational age. Reactive fetal tracing; monitored x 1 hour.  Pt has appt with [facility] on Friday.   Assessment and Plan   1. Traumatic injury during pregnancy in third trimester   2. [redacted] weeks gestation of pregnancy    -reviewed reasons to return to MAU -keep appt with ob  Judeth Horn 10/04/2020, 5:22 PM

## 2020-10-04 NOTE — Discharge Instructions (Signed)
Fetal Movement Counts Patient Name: ________________________________________________ Patient Due Date: ____________________  What is a fetal movement count? A fetal movement count is the number of times that you feel your baby move during a certain amount of time. This may also be called a fetal kick count. A fetal movement count is recommended for every pregnant woman. You may be asked to start counting fetal movements as early as week 28 of your pregnancy. Pay attention to when your baby is most active. You may notice your baby's sleep and wake cycles. You may also notice things that make your baby move more. You should do a fetal movement count:  When your baby is normally most active.  At the same time each day. A good time to count movements is while you are resting, after having something to eat and drink. How do I count fetal movements? 1. Find a quiet, comfortable area. Sit, or lie down on your side. 2. Write down the date, the start time and stop time, and the number of movements that you felt between those two times. Take this information with you to your health care visits. 3. Write down your start time when you feel the first movement. 4. Count kicks, flutters, swishes, rolls, and jabs. You should feel at least 10 movements. 5. You may stop counting after you have felt 10 movements, or if you have been counting for 2 hours. Write down the stop time. 6. If you do not feel 10 movements in 2 hours, contact your health care provider for further instructions. Your health care provider may want to do additional tests to assess your baby's well-being. Contact a health care provider if:  You feel fewer than 10 movements in 2 hours.  Your baby is not moving like he or she usually does. Date: ____________ Start time: ____________ Stop time: ____________ Movements: ____________ Date: ____________ Start time: ____________ Stop time: ____________ Movements: ____________ Date: ____________  Start time: ____________ Stop time: ____________ Movements: ____________ Date: ____________ Start time: ____________ Stop time: ____________ Movements: ____________ Date: ____________ Start time: ____________ Stop time: ____________ Movements: ____________ Date: ____________ Start time: ____________ Stop time: ____________ Movements: ____________ Date: ____________ Start time: ____________ Stop time: ____________ Movements: ____________ Date: ____________ Start time: ____________ Stop time: ____________ Movements: ____________ Date: ____________ Start time: ____________ Stop time: ____________ Movements: ____________ This information is not intended to replace advice given to you by your health care provider. Make sure you discuss any questions you have with your health care provider. Document Revised: 12/31/2018 Document Reviewed: 12/31/2018 Elsevier Patient Education  2021 [employer] Preventing Injuries During Pregnancy Trauma is a common cause of injury and death in pregnant women. The most common types of trauma that pregnant women experience are car accidents, falls, and domestic violence. Most trauma in pregnancy results in minor injuries to the mother. However, serious harm to the baby can happen even in a minor trauma. How can injuries during pregnancy affect me and my baby? Your baby is protected in the uterus by a sac filled with fluid (amniotic sac). Your baby can be harmed if there is a hard, direct hit to your abdomen and pelvis. These injuries can result in:  Tearing of your uterus.  The placenta pulling away from the wall of the uterus (placental abruption).  The amniotic sac breaking open (rupture of membranes).  Decrease in the blood supply to your baby.  Going into labor earlier than expected.  Injuries such as cuts, bruises, or bone fractures.  Severe  injuries to parts of your body, such as your brain, spine, heart, lungs, or other organs. What can increase my  risk?  Having slippery or cluttered floors.  Not wearing a seat belt in a motor vehicle, or unsafe driving behavior.  Playing high-contact sports.  Participating in activities that have a high risk for injury, such as skiing and horseback riding.  Falling.  Dizziness or fainting.  Spousal abuse, domestic violence, or being physically attacked. What actions can I take to prevent injuries during pregnancy? Safety  Remove slippery rugs and loose objects from the floor.  Wear comfortable shoes that have a good grip on the sole. Do not wear high-heeled shoes.  Always wear your seat belt properly when riding in a car. Use both the lap and shoulder belt, with the lap belt below your abdomen. Always practice safe driving.  Do not ride on a motorcycle while pregnant.   Activity  Be aware of your environment to avoid falling, slipping, or tripping.  Do not participate in high-contact sports.  Do not engage in activities that present a high risk for falling. General instructions  Take over-the-counter and prescription medicines only as told by your health care provider.  Stay well hydrated and eat small, frequent meals to avoid dizziness or feeling faint from dehydration or low blood sugar.  If you are a victim of domestic violence or any type of assault: ? Call your local emergency services (911 in the [place] ? Contact the [employer] Violence Hotline for help and support. Call 870-748-5235 to speak to someone at the hotline, or connect with an advocate through chat on their website at www.thehotline.org Get help right away if:  You fall on your abdomen or experience any serious hard, direct hit to your abdomen.  You cannot feel the baby moving after any type of trauma, or you feel that the baby is not moving as much as before the trauma.  You have been the victim of domestic violence or any other kind of physical attack.  You have been in a car accident.  You have  bleeding or leaking of fluid from the vagina.  You have contractions after trauma.  You become weak, faint, or have uncontrolled vomiting after trauma. Summary  Trauma is a common cause of injury and death in pregnant women and can also lead to serious harm to the baby.  Falls, car accidents, domestic violence, or assault can injure you or your baby. Make sure to get medical help right away if you experience any of these during your pregnancy.  Always wear your seat belt properly when riding in a car. Practice safe driving.  Be aware of your environment to avoid falling, slipping, or tripping.  Avoid contact sports or activities with a high risk of falling. This information is not intended to replace advice given to you by your health care provider. Make sure you discuss any questions you have with your health care provider. Document Revised: 06/23/2019 Document Reviewed: 06/23/2019 Elsevier Patient Education  2021 [employer].

## 2020-10-04 NOTE — MAU Note (Signed)
Pt reports that she has a step stool to get into bed because it is high up. Pt reports last night while getting up to go to the bathroom and missed the step stool fell into the bed and thinks that the step stool hit her in the right abdomen. Pt reports when she leans to the left it hurts on the right side. Pt reports that she feels like she pulled something.   Denies vaginal bleeding or LOF.   Reports +FM

## 2020-10-12 DIAGNOSIS — Z3685 Encounter for antenatal screening for Streptococcus B: Secondary | ICD-10-CM | POA: Diagnosis not present

## 2020-10-16 LAB — OB RESULTS CONSOLE ABO/RH: RH Type: POSITIVE

## 2020-10-16 LAB — OB RESULTS CONSOLE ANTIBODY SCREEN: Antibody Screen: NEGATIVE

## 2020-10-16 LAB — OB RESULTS CONSOLE GBS: GBS: NEGATIVE

## 2020-10-24 ENCOUNTER — Other Ambulatory Visit: Payer: Self-pay

## 2020-10-24 ENCOUNTER — Encounter (HOSPITAL_COMMUNITY): Payer: Self-pay | Admitting: Obstetrics and Gynecology

## 2020-10-24 ENCOUNTER — Inpatient Hospital Stay (HOSPITAL_COMMUNITY): Payer: Medicaid Other | Admitting: Anesthesiology

## 2020-10-24 ENCOUNTER — Inpatient Hospital Stay (HOSPITAL_COMMUNITY)
Admission: AD | Admit: 2020-10-24 | Discharge: 2020-10-25 | DRG: 807 | Disposition: A | Discharge status: Home / Self Care | Payer: Medicaid Other | Attending: Obstetrics & Gynecology | Admitting: Obstetrics & Gynecology

## 2020-10-24 DIAGNOSIS — Z3A38 38 weeks gestation of pregnancy: Secondary | ICD-10-CM

## 2020-10-24 DIAGNOSIS — Z20822 Contact with and (suspected) exposure to covid-19: Secondary | ICD-10-CM | POA: Diagnosis not present

## 2020-10-24 DIAGNOSIS — O9952 Diseases of the respiratory system complicating childbirth: Secondary | ICD-10-CM | POA: Diagnosis not present

## 2020-10-24 DIAGNOSIS — O26893 Other specified pregnancy related conditions, third trimester: Secondary | ICD-10-CM | POA: Diagnosis not present

## 2020-10-24 DIAGNOSIS — J45909 Unspecified asthma, uncomplicated: Secondary | ICD-10-CM | POA: Diagnosis not present

## 2020-10-24 LAB — CBC
HCT: 36.7 % (ref 36.0–46.0)
Hemoglobin: 12.6 g/dL (ref 12.0–15.0)
MCH: 32.5 pg (ref 26.0–34.0)
MCHC: 34.3 g/dL (ref 30.0–36.0)
MCV: 94.6 fL (ref 80.0–100.0)
Platelets: 244 10*3/uL (ref 150–400)
RBC: 3.88 MIL/uL (ref 3.87–5.11)
RDW: 14 % (ref 11.5–15.5)
WBC: 11.2 10*3/uL — ABNORMAL HIGH (ref 4.0–10.5)
nRBC: 0 % (ref 0.0–0.2)

## 2020-10-24 LAB — RESP PANEL BY RT-PCR (FLU A&B, COVID) ARPGX2
Influenza A by PCR: NEGATIVE
Influenza B by PCR: NEGATIVE
SARS Coronavirus 2 by RT PCR: NEGATIVE

## 2020-10-24 LAB — TYPE AND SCREEN
ABO/RH(D): A POS
Antibody Screen: NEGATIVE

## 2020-10-24 LAB — RPR: RPR Ser Ql: NONREACTIVE

## 2020-10-24 MED ORDER — IBUPROFEN 600 MG PO TABS
600.0000 mg | ORAL_TABLET | Freq: Four times a day (QID) | ORAL | Status: DC
  Administered 2020-10-24 – 2020-10-25 (×5): 600 mg via ORAL
  Filled 2020-10-24 (×5): qty 1

## 2020-10-24 MED ORDER — OXYCODONE-ACETAMINOPHEN 5-325 MG PO TABS: 1.0000 | ORAL_TABLET | ORAL | Status: DC | PRN

## 2020-10-24 MED ORDER — DIPHENHYDRAMINE HCL 50 MG/ML IJ SOLN: 12.5000 mg | INTRAMUSCULAR | Status: DC | PRN

## 2020-10-24 MED ORDER — PHENYLEPHRINE 40 MCG/ML (10ML) SYRINGE FOR IV PUSH (FOR BLOOD PRESSURE SUPPORT): 80.0000 ug | PREFILLED_SYRINGE | INTRAVENOUS | Status: DC | PRN

## 2020-10-24 MED ORDER — ONDANSETRON HCL 4 MG PO TABS: 4.0000 mg | ORAL_TABLET | ORAL | Status: DC | PRN

## 2020-10-24 MED ORDER — LIDOCAINE HCL (PF) 1 % IJ SOLN: 30.0000 mL | INTRAMUSCULAR | Status: DC | PRN

## 2020-10-24 MED ORDER — FENTANYL-BUPIVACAINE-NACL 0.5-0.125-0.9 MG/250ML-% EP SOLN
EPIDURAL | Status: AC
  Filled 2020-10-24: qty 250

## 2020-10-24 MED ORDER — OXYTOCIN BOLUS FROM INFUSION: 333.0000 mL | Freq: Once | INTRAVENOUS | Status: DC

## 2020-10-24 MED ORDER — EPHEDRINE 5 MG/ML INJ: 10.0000 mg | INTRAVENOUS | Status: DC | PRN

## 2020-10-24 MED ORDER — ONDANSETRON HCL 4 MG/2ML IJ SOLN: 4.0000 mg | INTRAMUSCULAR | Status: DC | PRN

## 2020-10-24 MED ORDER — DIBUCAINE (PERIANAL) 1 % EX OINT: 1.0000 "application " | TOPICAL_OINTMENT | CUTANEOUS | Status: DC | PRN

## 2020-10-24 MED ORDER — WITCH HAZEL-GLYCERIN EX PADS: 1.0000 "application " | MEDICATED_PAD | CUTANEOUS | Status: DC | PRN

## 2020-10-24 MED ORDER — LACTATED RINGERS IV SOLN: 500.0000 mL | Freq: Once | INTRAVENOUS | Status: DC

## 2020-10-24 MED ORDER — SOD CITRATE-CITRIC ACID 500-334 MG/5ML PO SOLN: 30.0000 mL | ORAL | Status: DC | PRN

## 2020-10-24 MED ORDER — LACTATED RINGERS IV SOLN: 500.0000 mL | INTRAVENOUS | Status: DC | PRN

## 2020-10-24 MED ORDER — LIDOCAINE HCL (PF) 1 % IJ SOLN
INTRAMUSCULAR | Status: DC | PRN
  Administered 2020-10-24: 10 mL via EPIDURAL

## 2020-10-24 MED ORDER — COCONUT OIL OIL: 1.0000 "application " | TOPICAL_OIL | Status: DC | PRN

## 2020-10-24 MED ORDER — FLEET ENEMA 7-19 GM/118ML RE ENEM: 1.0000 | ENEMA | Freq: Every day | RECTAL | Status: DC | PRN

## 2020-10-24 MED ORDER — FENTANYL CITRATE (PF) 100 MCG/2ML IJ SOLN
INTRAMUSCULAR | Status: AC
  Administered 2020-10-24: 100 ug
  Filled 2020-10-24: qty 2

## 2020-10-24 MED ORDER — ACETAMINOPHEN 325 MG PO TABS: 650.0000 mg | ORAL_TABLET | ORAL | Status: DC | PRN

## 2020-10-24 MED ORDER — PRENATAL MULTIVITAMIN CH
1.0000 | ORAL_TABLET | Freq: Every day | ORAL | Status: DC
  Administered 2020-10-24 – 2020-10-25 (×2): 1 via ORAL
  Filled 2020-10-24 (×2): qty 1

## 2020-10-24 MED ORDER — SENNOSIDES-DOCUSATE SODIUM 8.6-50 MG PO TABS
2.0000 | ORAL_TABLET | Freq: Every day | ORAL | Status: DC
  Administered 2020-10-25: 2 via ORAL
  Filled 2020-10-24: qty 2

## 2020-10-24 MED ORDER — FENTANYL CITRATE (PF) 100 MCG/2ML IJ SOLN: 100.0000 ug | Freq: Once | INTRAMUSCULAR | Status: DC

## 2020-10-24 MED ORDER — DIPHENHYDRAMINE HCL 25 MG PO CAPS: 25.0000 mg | ORAL_CAPSULE | Freq: Four times a day (QID) | ORAL | Status: DC | PRN

## 2020-10-24 MED ORDER — FENTANYL-BUPIVACAINE-NACL 0.5-0.125-0.9 MG/250ML-% EP SOLN
12.0000 mL/h | EPIDURAL | Status: DC | PRN
  Administered 2020-10-24: 12 mL/h via EPIDURAL

## 2020-10-24 MED ORDER — OXYCODONE-ACETAMINOPHEN 5-325 MG PO TABS: 2.0000 | ORAL_TABLET | ORAL | Status: DC | PRN

## 2020-10-24 MED ORDER — TETANUS-DIPHTH-ACELL PERTUSSIS 5-2.5-18.5 LF-MCG/0.5 IM SUSY: 0.5000 mL | PREFILLED_SYRINGE | Freq: Once | INTRAMUSCULAR | Status: DC

## 2020-10-24 MED ORDER — OXYTOCIN-SODIUM CHLORIDE 30-0.9 UT/500ML-% IV SOLN
2.5000 [IU]/h | INTRAVENOUS | Status: DC
  Administered 2020-10-24: 2.5 [IU]/h via INTRAVENOUS
  Filled 2020-10-24: qty 500

## 2020-10-24 MED ORDER — BENZOCAINE-MENTHOL 20-0.5 % EX AERO: 1.0000 "application " | INHALATION_SPRAY | CUTANEOUS | Status: DC | PRN

## 2020-10-24 MED ORDER — LACTATED RINGERS IV SOLN: INTRAVENOUS | Status: DC

## 2020-10-24 MED ORDER — ACETAMINOPHEN 325 MG PO TABS
650.0000 mg | ORAL_TABLET | ORAL | Status: DC | PRN
  Administered 2020-10-24 – 2020-10-25 (×2): 650 mg via ORAL
  Filled 2020-10-24 (×2): qty 2

## 2020-10-24 MED ORDER — SIMETHICONE 80 MG PO CHEW
80.0000 mg | CHEWABLE_TABLET | ORAL | Status: DC | PRN
Start: 2020-10-24 — End: 2020-10-25

## 2020-10-24 MED ORDER — ZOLPIDEM TARTRATE 5 MG PO TABS: 5.0000 mg | ORAL_TABLET | Freq: Every evening | ORAL | Status: DC | PRN

## 2020-10-24 MED ORDER — ONDANSETRON HCL 4 MG/2ML IJ SOLN: 4.0000 mg | Freq: Four times a day (QID) | INTRAMUSCULAR | Status: DC | PRN

## 2020-10-24 NOTE — Anesthesia Preprocedure Evaluation (Signed)
Anesthesia Evaluation  Patient identified by MRN, date of birth, ID band Patient awake    Reviewed: Allergy & Precautions, H&P , NPO status , Patient's Chart, lab work & pertinent test results  History of Anesthesia Complications Negative for: history of anesthetic complications  Airway Mallampati: II  TM Distance: >3 FB Neck ROM: full    Dental no notable dental hx.    Pulmonary asthma ,    Pulmonary exam normal        Cardiovascular negative cardio ROS Normal cardiovascular exam Rhythm:regular Rate:Normal     Neuro/Psych negative neurological ROS  negative psych ROS   GI/Hepatic negative GI ROS, Neg liver ROS,   Endo/Other  negative endocrine ROS  Renal/GU negative Renal ROS  negative genitourinary   Musculoskeletal   Abdominal   Peds  Hematology negative hematology ROS (+)   Anesthesia Other Findings   Reproductive/Obstetrics (+) Pregnancy                             Anesthesia Physical Anesthesia Plan  ASA: II  Anesthesia Plan: Epidural   Post-op Pain Management:    Induction:   PONV Risk Score and Plan:   Airway Management Planned:   Additional Equipment:   Intra-op Plan:   Post-operative Plan:   Informed Consent: I have reviewed the patients History and Physical, chart, labs and discussed the procedure including the risks, benefits and alternatives for the proposed anesthesia with the patient or authorized representative who has indicated his/her understanding and acceptance.       Plan Discussed with:   Anesthesia Plan Comments:         Anesthesia Quick Evaluation  

## 2020-10-24 NOTE — Lactation Note (Signed)
Lactation Consultation Note  Patient Name: Mackenzie Garcia [id number] Date: 10/24/2020 Reason for consult: L&D Initial assessment Age:35 y.o.  Lactation reported to L&D to assist with initiating baby's first latch. I placed baby in cradle hold on the right breast. We initiated sucking reflex with a gloved finger after baby initially tried to establish a latch, and I helped [name] hand express colostrum. She has copious colostrum. She states that she had difficulty latching baby due to her nipple size. I showed her how to sandwich her tissue.  After a few attempts baby latched with rhythmic suckling sequences. I observed for a few minutes and then left, so mom could bond with baby.  [name] is a P3; she breast fed her previous two for several months. She would like lactation to follow up with her on the mother baby floor.    LATCH Score Latch: Grasps breast easily, tongue down, lips flanged, rhythmical sucking.  Audible Swallowing: A few with stimulation  Type of Nipple: Everted at rest and after stimulation  Comfort (Breast/Nipple): Soft / non-tender  Hold (Positioning): Assistance needed to correctly position infant at breast and maintain latch.  LATCH Score: 8    Interventions Interventions: Breast feeding basics reviewed;Assisted with latch;Skin to skin;Hand express;Breast compression;Adjust position  Discharge    Consult Status Consult Status: Follow-up Date: 10/24/20 Follow-up type: In-patient    Mackenzie Garcia 10/24/2020, 10:48 AM

## 2020-10-24 NOTE — Anesthesia Procedure Notes (Addendum)
Epidural Patient location during procedure: OB Start time: 10/24/2020 7:41 AM End time: 10/24/2020 7:59 AM  Staffing Anesthesiologist: Lucretia Kern, MD Performed: anesthesiologist   Preanesthetic Checklist Completed: patient identified, IV checked, risks and benefits discussed, monitors and equipment checked, pre-op evaluation and timeout performed  Epidural Patient position: sitting Prep: DuraPrep Patient monitoring: heart rate, continuous pulse ox and blood pressure Approach: midline Location: L3-L4 Injection technique: LOR air  Needle:  Needle type: Tuohy  Needle gauge: 18 G Needle length: 9 cm Needle insertion depth: 8 cm Catheter type: closed end flexible Catheter size: 20 Guage Catheter at skin depth: 13 cm Test dose: negative  Assessment Events: blood not aspirated, injection not painful, no injection resistance, no paresthesia and negative IV test  Additional Notes Reason for block:procedure for pain

## 2020-10-24 NOTE — H&P (Addendum)
KOURTNEE LAHEY is a 35 y.o. female, G3P2002, IUP at 38.3 weeks, presenting for active  labor, intact, gbs-, female. 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 Pt endorse + Fm. Denies vaginal leakage. Denies vaginal bleeding.    Patient Active Problem List   Diagnosis Date Noted  . Normal labor 10/24/2020  . Maternal age 110+, multigravida, antepartum 10/04/2020  . Abnormal chromosomal and genetic finding on antenatal screening of mother 05/04/2020  . Abnormal cervical Papanicolaou smear 04/21/2019  . Wheezing 09/21/2018  . 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 Adverse food reaction 09/21/2018  . BPPV (benign paroxysmal positional vertigo), right 07/17/2017     Active Ambulatory Problems    Diagnosis Date Noted  . BPPV (benign paroxysmal positional vertigo), right 07/17/2017  . Wheezing 09/21/2018  . 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 Adverse food reaction 09/21/2018  . Abnormal chromosomal and genetic finding on antenatal screening of mother 05/04/2020  . Abnormal cervical Papanicolaou smear 04/21/2019  . Maternal age 54+, multigravida, antepartum 10/04/2020   Resolved Ambulatory Problems    Diagnosis Date Noted  . Other allergic rhinitis 09/21/2018  . Allergic conjunctivitis of both eyes 09/21/2018  . Other atopic dermatitis 09/21/2018   Past Medical History:  Diagnosis Date  . Anxiety   . Chlamydia   . Depression   . Eczema   . Vaginal Pap smear, abnormal       Medications Prior to Admission  Medication Sig Dispense Refill Last Dose  . Prenatal Vit-Fe Fumarate-FA (PRENATAL MULTIVITAMIN) TABS tablet Take 1 tablet by mouth daily at 12 noon.   10/23/2020 at Unknown time  . triamcinolone ointment (KENALOG) 0.1 % triamcinolone acetonide 0.1 % topical ointment  APPLY A THIN LAYER TO THE AFFECTED AREA(S) BY TOPICAL ROUTE 2 TIMES PER DAY   10/23/2020 at Unknown time  . metoCLOPramide (REGLAN) 10 MG tablet metoclopramide 10 mg tablet  Take 1 tablet 4 times a day by  oral route.       Past Medical History:  Diagnosis Date  . Anxiety   . Chlamydia   . Depression   . Eczema   . Vaginal Pap smear, abnormal      No current facility-administered medications on file prior to encounter.   Current Outpatient Medications on File Prior to Encounter  Medication Sig Dispense Refill  . Prenatal Vit-Fe Fumarate-FA (PRENATAL MULTIVITAMIN) TABS tablet Take 1 tablet by mouth daily at 12 noon.    . triamcinolone ointment (KENALOG) 0.1 % triamcinolone acetonide 0.1 % topical ointment  APPLY A THIN LAYER TO THE AFFECTED AREA(S) BY TOPICAL ROUTE 2 TIMES PER DAY    . metoCLOPramide (REGLAN) 10 MG tablet metoclopramide 10 mg tablet  Take 1 tablet 4 times a day by oral route.       Allergies  Allergen Reactions  . Banana Itching    Mouth and throat itch  . Soy Allergy Other (See Comments)    Pt is unsure of reaction     History of present pregnancy: Pt Info/Preference:  Screening/Consents:  Labs:   EDD: Estimated Date of Delivery: 11/04/20  Establised: Patient's last menstrual period was 02/06/2020.  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 AFP:  First Tri: Quad:  Office: [facility]            First PNV: 9.3 weeks Blood Type --/--/A POS (05/31 0603)  Language: english Last PNV: 37 weeks Rhogam    Flu Vaccine:  ???   Antibody  NEG (05/31 0603)  TDaP vaccine ???   GTT: Early: 5.8 Third Trimester: passed 3 H GTT failed one hour 137  Feeding Plan: breast BTL: no Rubella: Immune (11/15 0000)  Contraception: ??? VBAC: no RPR:   immune  Circumcision: N/A female   HBsAg: Negative (11/15 0000)  Pediatrician:  Dr Donnie Coffin   HIV: Non-reactive (11/15 0000)   Prenatal Classes: no Additional [place]: 5/6 growth 5.5lbs, 35% GBS: Negative/-- (05/23 0000)(For PCN allergy, check sensitivities)       Chlamydia: neg    MFM Referral/Consult:  GC: neg  Support Person: Best freind   PAP:  2021 normal  Pain Management: epidural Neonatologist Referral:  Hgb Electrophoresis:  AA  Birth Plan: DCC   Hgb NOB: 12.1    28W: 11.6   OB History    Gravida  3   Para  2   Term  2   Preterm      AB      Living  2     SAB      IAB      Ectopic      Multiple      Live Births  2          Past Medical History:  Diagnosis Date  . Anxiety   . Chlamydia   . Depression   . Eczema   . Vaginal Pap smear, abnormal    Past Surgical History:  Procedure Laterality Date  . LEEP     Family History: family history includes Diabetes in her father, maternal grandmother, and maternal uncle; Healthy in her brother, mother, sister, and sister. Social History:  reports that she has never smoked. She has never used smokeless tobacco. She reports that she does not drink alcohol and does not use drugs.   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 Maternal Ultrasounds/Referrals: Normal Fetal Ultrasounds or other Referrals:  None Maternal Substance Abuse:  No Significant Maternal Medications:  None Significant Maternal Lab Results: Group B Strep negative  ROS:  Review of Systems  Constitutional: Negative.   HENT: Negative.   Eyes: Negative.   Respiratory: Negative.   Cardiovascular: Negative.   Gastrointestinal: Positive for abdominal pain.  Genitourinary: Negative.   Musculoskeletal: Negative.   Skin: Negative.   Neurological: Negative.   Endo/Heme/Allergies: Negative.   Psychiatric/Behavioral: Negative.      Physical Exam: BP (!) 150/88   Pulse 89   Temp 98.5 F (36.9 C) (Oral)   Ht 5' 5.5" (1.664 m)   Wt 88 kg   LMP 02/06/2020   BMI 31.79 kg/m   Physical Exam Vitals and nursing note reviewed. Exam conducted with a chaperone present.  Constitutional:      Appearance: Normal appearance.  HENT:     Head: Normocephalic and atraumatic.     Nose: Nose  normal.     Mouth/Throat:     Mouth: Mucous membranes are moist.  Eyes:     Conjunctiva/sclera: Conjunctivae normal.  Cardiovascular:     Rate and Rhythm: Normal rate and regular rhythm.     Pulses: Normal pulses.     Heart sounds: Normal heart sounds.  Pulmonary:     Breath sounds: Normal breath sounds.  Abdominal:     General: Bowel sounds are normal.  Genitourinary:    Comments: Uterus gravida, pelvis adequate  Musculoskeletal:        General: Normal range of motion.  Cervical back: Normal range of motion and neck supple.  Skin:    General: Skin is warm.     Capillary Refill: Capillary refill takes less than 2 seconds.  Neurological:     General: No focal deficit present.     Mental Status: She is alert.  Psychiatric:        Mood and Affect: Mood normal.      NST: FHR baseline 135 bpm, Variability: minimal , Accelerations:present, Decelerations:  Present early decels= Cat 2/Reactive to scalp stimulation  UC:   regular, every 3 minutes, lasting 70-100 seconds SVE:   Dilation: 9 Effacement (%): 90 Station: 0 Exam by:: lima, rn, vertex verified by fetal sutures.  Leopold's: Position vertex, EFW 8.5 via leopold's. Pelvis proven to 8.5lbs.    Labs: Results for orders placed or performed during the hospital encounter of 10/24/20 (from the past 24 hour(s))  Resp Panel by RT-PCR (Flu A&B, Covid) Nasopharyngeal Swab     Status: None   Collection Time: 10/24/20  6:01 AM   Specimen: Nasopharyngeal Swab; Nasopharyngeal(NP) swabs in vial transport medium  Result Value Ref Range   SARS Coronavirus 2 by RT PCR NEGATIVE NEGATIVE   Influenza A by PCR NEGATIVE NEGATIVE   Influenza B by PCR NEGATIVE NEGATIVE  CBC     Status: Abnormal   Collection Time: 10/24/20  6:01 AM  Result Value Ref Range   WBC 11.2 (H) 4.0 - 10.5 K/uL   RBC 3.88 3.87 - 5.11 MIL/uL   Hemoglobin 12.6 12.0 - 15.0 g/dL   HCT 96.0 45.4 - 09.8 %   MCV 94.6 80.0 - 100.0 fL   MCH 32.5 26.0 - 34.0 pg   MCHC  34.3 30.0 - 36.0 g/dL   RDW 11.9 14.7 - 82.9 %   Platelets 244 150 - 400 K/uL   nRBC 0.0 0.0 - 0.2 %  Type and screen [facility]     Status: None   Collection Time: 10/24/20  6:03 AM  Result Value Ref Range   ABO/RH(D) A POS    Antibody Screen NEG    Sample Expiration      10/27/2020,2359 Performed at [facility], [address], [place], [place] [address]     Imaging:  [place] MFM OB FOLLOW UP  Result Date: 09/28/2020 ----------------------------------------------------------------------  OBSTETRICS REPORT                       (Signed Final 09/28/2020 04:06 pm) ---------------------------------------------------------------------- Patient Info  ID #:       [id number]                          D.O.B.:  [birth year] (35 yrs)  Name:       Kathlee Nations Meine                Visit Date: 09/28/2020 03:11 pm ---------------------------------------------------------------------- Performed By  Attending:        Ma Rings MD         Ref. Address:     [facility]                                                             [address]                                                             [address]                                                             [place], [place]                                                             [address]  Performed By:     Fayne Norrie BS,      Location:         [facility]                    RDMS, RVT                                [facility]  Referred By:      Nigel Bridgeman                    CNM ---------------------------------------------------------------------- Orders  #  Description                            Code        Ordered By  1  [place] MFM OB FOLLOW UP  [address].60    [facility] ----------------------------------------------------------------------  #  Order #                     Accession #                Episode #  1  [id number]                   [id number]                 [id number] ---------------------------------------------------------------------- Indications  Abnormal biochemical screen ( atypical  sex    O28.9  chromosome)  Advanced maternal age multigravida 91+,        O74.522  second trimester  [redacted] weeks gestation of pregnancy                Z3A.34 ---------------------------------------------------------------------- Fetal Evaluation  Num Of Fetuses:         1  Fetal Heart Rate(bpm):  145  Cardiac Activity:       Observed  Presentation:           Cephalic  Placenta:               Posterior Fundal  P. Cord Insertion:      Previously Visualized  Amniotic Fluid  AFI FV:      Within normal limits  AFI Sum(cm)     %Tile       Largest Pocket(cm)  9.4             16          2.76  RUQ(cm)       RLQ(cm)       LUQ(cm)        LLQ(cm)  2.76          2.55          2.2            1.89 ---------------------------------------------------------------------- Biometry  BPD:      88.4  mm     G. Age:  35w 5d         79  %    CI:        79.57   %    70 - 86                                                          FL/HC:      21.1   %    20.1 - 22.3  HC:      313.2  mm     G. Age:  35w 1d         24  %    HC/AC:      1.04        0.93 - 1.11  AC:      302.1  mm     G. Age:  34w 1d         40  %    FL/BPD:     74.7   %    71 - 87  FL:         66  mm     G. Age:  34w 0d         24  %    FL/AC:      21.8   %  20 - 24  LV:          5  mm  Est. FW:    2413  gm      5 lb 5 oz     35  % ---------------------------------------------------------------------- OB History  Gravidity:    3         Term:   2  Living:       2 ---------------------------------------------------------------------- Gestational  Age  U/S Today:     34w 5d                                        EDD:   11/04/20  Best:          34w 5d     Det. By:  Previous Ultrasound      EDD:   11/04/20                                      (04/06/20) ---------------------------------------------------------------------- Anatomy  Cranium:               Appears normal         LVOT:                   Previously seen  Cavum:                 Appears normal         Aortic Arch:            Previously seen  Ventricles:            Appears normal         Ductal Arch:            Previously seen  Choroid Plexus:        Previously seen        Diaphragm:              Previously seen  Cerebellum:            Previously seen        Stomach:                Appears normal, left                                                                        sided  Posterior Fossa:       Previously seen        Abdomen:                Appears normal  Nuchal Fold:           Previously seen        Abdominal Wall:         Previously seen  Face:                  Orbits and profile     Cord Vessels:           Appears normal (3  previously seen                                vessel cord)  Lips:                  Appears normal         Kidneys:                Appear normal  Palate:                Previously seen        Bladder:                Appears normal  Thoracic:              Appears normal         Spine:                  Previously seen  Heart:                 Appears normal         Upper Extremities:      Previously seen                         (4CH, axis, and                         situs)  RVOT:                  Previously seen        Lower Extremities:      Previously seen  Other:  Fetus appears to be female. Nasal bone and Hands prev. visualized. ---------------------------------------------------------------------- Cervix Uterus Adnexa  Cervix  Not visualized (advanced GA >24wks) ---------------------------------------------------------------------- Comments   This patient was seen for a follow up growth scan as her cell  free DNA test indicated atypical sex chromosomes.  She  denies any problems since her last exam.  She was informed that the fetal growth and amniotic fluid  level appears appropriate for her gestational age.  As the fetal growth is within normal limits, no further exams  were scheduled in our office. ----------------------------------------------------------------------                   Ma Rings, MD Electronically Signed Final Report   09/28/2020 04:06 pm ----------------------------------------------------------------------   MAU Course: Orders Placed This Encounter  Procedures  . Resp Panel by RT-PCR (Flu A&B, Covid) Nasopharyngeal Swab  . OB RESULT CONSOLE Group B Strep  . CBC  . RPR  . OB RESULTS CONSOLE GC/Chlamydia  . OB RESULTS CONSOLE HIV antibody  . OB RESULTS CONSOLE Rubella Antibody  . OB RESULTS CONSOLE Hepatitis B surface antigen  . Diet clear liquid Room service appropriate? Yes; Fluid consistency: Thin  . Vitals signs per unit policy  . Notify Physician  . Fetal monitoring per unit policy  . Activity as tolerated  . Cervical Exam  . Measure blood pressure post delivery every 15 min x 1 hour then every 30 min x 1 hour  . Fundal check post delivery every 15 min x 1 hour then every 30 min x 1 hour  . If Rapid HIV test positive or known HIV positive: initiate AZT orders  . May in and out cath x 2 for inability to void  . Discontinue foley  prior to vaginal delivery  . Initiate Carrier Fluid Protocol  . Initiate Oral Care Protocol  . Order Rapid HIV per protocol if no results on chart  . Patient may have epidural placement upon request  . May use local infiltration of 1% lidocaine plain to produce a skin wheal prior to IV insertion  . Notify in-house Anesthesia team of nausea and vomiting greater than 5 hours  . Assess for signs/symptoms of PIH/preeclampsia  . RN to place order for: CBC if one has not been  drawn in the past 6 hours for all patients with hypertensive disease, pre-eclampsia, eclampsia, thrombocytopenia or previous PLTC<150,000.  . Identify to Anesthesia if patient plans to have postpartum tubal ligation; do not remove epidural without discussion with Anesthesiologist  . Vital signs following Epidural Placement, re-bolus or re-dose monitor patient's BP and oxygen saturation every 5 minutes for 30 minutes  . RN to remain at bedside continuously for 30 minutes post epidural placement, post re-bolus / re-dose  . Notify Anesthesia if the patient becomes short of breath or complains of heaviness in chest, chest pain, and/or unrelieved pain  . Notify Anesthesia prior to discontinuing epidural infusion  . Discontinue epidural infusion at conclusion of delivery and/or repair unless otherwise indicated by provider  . Full code  . Airborne and Contact precautions  . Type and screen [facility]  . OB RESULTS CONSOLE ABO/Rh  . OB RESULTS CONSOLE Antibody Screen  . Insert and maintain IV Line  . Admit to Inpatient (patient's expected length of stay will be greater than 2 midnights or inpatient only procedure)   Meds ordered this encounter  Medications  . lactated ringers infusion  . oxytocin (PITOCIN) IV BOLUS FROM BAG  . oxytocin (PITOCIN) IV infusion 30 units in NS 500 mL - Premix  . lactated ringers infusion 500-1,000 mL  . acetaminophen (TYLENOL) tablet 650 mg  . oxyCODONE-acetaminophen (PERCOCET/ROXICET) 5-325 MG per tablet 1 tablet  . oxyCODONE-acetaminophen (PERCOCET/ROXICET) 5-325 MG per tablet 2 tablet  . sodium phosphate (FLEET) 7-19 GM/118ML enema 1 enema  . ondansetron (ZOFRAN) injection 4 mg  . sodium citrate-citric acid (ORACIT) solution 30 mL  . lidocaine (PF) (XYLOCAINE) 1 % injection 30 mL  . fentaNYL (SUBLIMAZE) injection 100 mcg  . fentaNYL (SUBLIMAZE) 100 MCG/2ML injection    Bodamer, [place]   : cabinet override  . ePHEDrine injection 10 mg  .  PHENYLephrine 40 mcg/ml in normal saline Adult IV Push Syringe (For Blood Pressure Support)  . lactated ringers infusion 500 mL  . fentaNYL 2 mcg/mL w/ bupivacaine 0.125% in NS 250 mL epidural infusion ([facility]-ANES)  . diphenhydrAMINE (BENADRYL) injection 12.5 mg  . ePHEDrine injection 10 mg  . PHENYLephrine 40 mcg/ml in normal saline Adult IV Push Syringe (For Blood Pressure Support)  . fentaNYL 2 mcg/mL w/bupivacaine 0.125% in NS 250 mL 0.5-0.125-0.9 MG/250ML-%    Herma Carson   : cabinet override    Assessment/Plan: DESTONY PREVOST is a 35 y.o. female, G3P2002, IUP at 38.3 weeks, presenting for active  labor, intact, gbs-, female. 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 Pt endorse + Fm. Denies vaginal leakage. Denies vaginal bleeding.   FWB: Cat 2 Fetal Tracing.   Plan: Admit to Birthing Suite per consult with Dr Sallye Ober Routine [facility] orders Fluid bolus and position change for Cat 2 Pt desires epidural now.  Pain med/epidural prn Expectant management pt waiting for partner to deliver.  Anticipate labor progression   [facility] NP-C,  CNM, MSN 10/24/2020, 8:05 AM

## 2020-10-24 NOTE — Social Work (Signed)
MOB was referred for history of depression and anxiety.   * Referral screened out by Clinical Social Worker because none of the following criteria appear to apply:  ~ History of anxiety/depression during this pregnancy, or of post-partum depression following prior delivery. CSW notes no concerns during prenatal care. ~ Diagnosis of anxiety and/or depression within last 3 years. CSW reviewed chart and notes a diagnosis date of 2013 or earlier. OR * MOB's symptoms currently being treated with medication and/or therapy.  Please contact the Clinical Social Worker if needs arise, by [facility] request, or if MOB scores greater than 9/yes to question 10 on Edinburgh Postpartum Depression Screen.  Manfred Arch, LCSWA Clinical Social Work [employer] and [employer]  [phone]

## 2020-10-25 LAB — CBC
HCT: 30.7 % — ABNORMAL LOW (ref 36.0–46.0)
Hemoglobin: 10.8 g/dL — ABNORMAL LOW (ref 12.0–15.0)
MCH: 33.3 pg (ref 26.0–34.0)
MCHC: 35.2 g/dL (ref 30.0–36.0)
MCV: 94.8 fL (ref 80.0–100.0)
Platelets: 216 10*3/uL (ref 150–400)
RBC: 3.24 MIL/uL — ABNORMAL LOW (ref 3.87–5.11)
RDW: 14.3 % (ref 11.5–15.5)
WBC: 9.7 10*3/uL (ref 4.0–10.5)
nRBC: 0 % (ref 0.0–0.2)

## 2020-10-25 MED ORDER — ACETAMINOPHEN 325 MG PO TABS: 650.0000 mg | ORAL_TABLET | ORAL | Status: AC | PRN

## 2020-10-25 MED ORDER — IBUPROFEN 600 MG PO TABS: 600.0000 mg | ORAL_TABLET | Freq: Four times a day (QID) | ORAL | 0 refills | Status: AC

## 2020-10-25 NOTE — Lactation Note (Signed)
This note was copied from a baby's chart. Lactation Consultation Note  Patient Name: Mackenzie Garcia [id number] Date: 10/25/2020 Reason for consult: Follow-up assessment;Early term 37-38.6wks;Infant weight loss Age:36 hours  Visited with mom of 25 hours old ETI female, she's a P3 and experienced BF. Mom and baby are going home today, baby is at 3% weight loss. Reviewed discharge education, lactogenesis II, normal newborn behavior and pumping schedule. Mom had inverted nipples on admission but it looks like they've been everting some since she's been using the hand pump for pre-pumping.  Mom voiced that she doesn't like the hand pump that it's uncomfortable, advised to try pre-pumping applying some coconut oil prior pumping for added lubrication. Encouraged 8-12 feedings STS in 24 hours and to continue pre-pumping prior feedings. No support person in mom's room at the time of [facility] consultation. Mom reported all questions and concerns were answered, she's aware of LC OP services and will call PRN.   Maternal Data    Feeding Mother's Current Feeding Choice: Breast Milk  LATCH Score Latch: Grasps breast easily, tongue down, lips flanged, rhythmical sucking.  Audible Swallowing: A few with stimulation  Type of Nipple: Everted at rest and after stimulation  Comfort (Breast/Nipple): Soft / non-tender  Hold (Positioning): No assistance needed to correctly position infant at breast.  LATCH Score: 9   Lactation Tools Discussed/Used    Interventions Interventions: Breast feeding basics reviewed;Hand pump;Coconut oil  Discharge Discharge Education: Engorgement and breast care;Warning signs for feeding baby  Consult Status Consult Status: Complete Date: 10/25/20 Follow-up type: Call as needed    [name] Venetia Constable 10/25/2020, 11:22 AM

## 2020-10-25 NOTE — Anesthesia Postprocedure Evaluation (Signed)
Anesthesia Post Note  Patient: Mackenzie Garcia  Procedure(s) Performed: AN AD HOC LABOR EPIDURAL     Patient location during evaluation: Mother Baby Anesthesia Type: Epidural Level of consciousness: awake and alert, awake and oriented Pain management: pain level controlled Vital Signs Assessment: post-procedure vital signs reviewed and stable Respiratory status: spontaneous breathing, nonlabored ventilation and respiratory function stable Cardiovascular status: stable Postop Assessment: no headache, patient able to bend at knees, no apparent nausea or vomiting, no backache, adequate PO intake and able to ambulate Anesthetic complications: no   No complications documented.  Last Vitals:  Vitals:   10/24/20 2257 10/25/20 0458  BP: 117/70 114/67  Pulse: 100 84  Resp: 18 17  Temp: 36.9 C 36.5 C  SpO2:  99%    Last Pain:  Vitals:   10/25/20 0545  TempSrc:   PainSc: 6    Pain Goal:                   Cami Delawder

## 2020-10-25 NOTE — Discharge Summary (Signed)
Postpartum Discharge Summary  Date of Service updated 10/25/20    Patient Name: Mackenzie Garcia DOB: Oct 18, 1985 MRN: [id number]  Date of admission: 10/24/2020 Delivery date:10/24/2020  Delivering provider: Noralyn Pick  Date of discharge: 10/25/2020  Admitting diagnosis: Normal labor [O80, Z37.9] Intrauterine pregnancy: [redacted]w[redacted]d    Secondary diagnosis:  Active Problems:   Normal labor   SVD (spontaneous vaginal delivery)   Normal postpartum course  Additional problems: Advanced maternal age    Discharge diagnosis: Term Pregnancy Delivered                                              Post partum procedures:none Augmentation: none Complications: None  Hospital course: Onset of Labor With Vaginal Delivery      35y.o. yo G3P3003 at 366w3das admitted in Active Labor on 10/24/2020. Patient had an uncomplicated labor course as follows:  Membrane Rupture Time/Date: 9:15 AM ,10/24/2020   Delivery Method:Vaginal, Spontaneous  Episiotomy: None  Lacerations:    Patient had an uncomplicated postpartum course.  She is ambulating, tolerating a regular diet, passing flatus, and urinating well. Patient is discharged home in stable condition on 10/25/20.  Newborn Data: Birth date:10/24/2020  Birth time:9:54 AM  Gender:Female  Living status:Living  Apgars:9 ,9  Weight:3095 g   Magnesium Sulfate received: No BMZ received: No Rhophylac:N/A MMR:N/A Transfusion:No  Physical exam  Vitals:   10/24/20 1607 10/24/20 1940 10/24/20 2257 10/25/20 0458  BP: 119/72 138/70 117/70 114/67  Pulse: 85 97 100 84  Resp: _0 Temp: 98 F (36.7 C) 98.2 F (36.8 C) 98.5 F (36.9 C) 97.7 F (36.5 C)  TempSrc:  Oral Oral Oral  SpO2: 99%   99%  Weight:      Height:       General: alert, cooperative and no distress Lochia: appropriate Uterine Fundus: firm Incision: N/A DVT Evaluation: No evidence of DVT seen on physical exam. No cords or calf tenderness. No significant calf/ankle  edema. Labs: Lab Results  Component Value Date   WBC 9.7 10/25/2020   HGB 10.8 (L) 10/25/2020   HCT 30.7 (L) 10/25/2020   MCV 94.8 10/25/2020   PLT 216 10/25/2020   CMP Latest Ref Rng & Units 10/29/2019  Glucose 70 - 99 mg/dL 93  BUN 6 - 20 mg/dL 8  Creatinine 0.44 - 1.00 mg/dL 0.58  Sodium 135 - 145 mmol/L 138  Potassium 3.5 - 5.1 mmol/L 3.4(L)  Chloride 98 - 111 mmol/L 105  CO2 22 - 32 mmol/L 25  Calcium 8.9 - 10.3 mg/dL 9.1  Total Protein 6.5 - 8.1 g/dL 7.4  Total Bilirubin 0.3 - 1.2 mg/dL 1.3(H)  Alkaline Phos 38 - 126 U/L 42  AST 15 - 41 U/L 17  ALT 0 - 44 U/L 20   Edinburgh Score: Edinburgh Postnatal Depression Scale Screening Tool 10/24/2020  I have been able to laugh and see the funny side of things. 0  I have looked forward with enjoyment to things. 0  I have blamed myself unnecessarily when things went wrong. 0  I have been anxious or worried for no good reason. 0  I have felt scared or panicky for no good reason. 0  Things have been getting on top of me. 0  I have been so unhappy that I have had difficulty sleeping. 0  I have felt  sad or miserable. 0  I have been so unhappy that I have been crying. 0  The thought of harming myself has occurred to me. 0  Edinburgh Postnatal Depression Scale Total 0      After visit meds:  Allergies as of 10/25/2020      Reactions   Banana Itching   Mouth and throat itch   Soy Allergy Other (See Comments)   Pt is unsure of reaction       Medication List    STOP taking these medications   metoCLOPramide 10 MG tablet Commonly known as: REGLAN     TAKE these medications   acetaminophen 325 MG tablet Commonly known as: Tylenol Take 2 tablets (650 mg total) by mouth every 4 (four) hours as needed (for pain scale < 4).   ibuprofen 600 MG tablet Commonly known as: ADVIL Take 1 tablet (600 mg total) by mouth every 6 (six) hours.   prenatal multivitamin Tabs tablet Take 1 tablet by mouth daily at 12 noon.    triamcinolone ointment 0.1 % Commonly known as: KENALOG triamcinolone acetonide 0.1 % topical ointment  APPLY A THIN LAYER TO THE AFFECTED AREA(S) BY TOPICAL ROUTE 2 TIMES PER DAY        Discharge home in stable condition Infant Feeding: Breast Infant Disposition:home with mother Discharge instruction: per After Visit Summary and Postpartum booklet. Activity: Advance as tolerated. Pelvic rest for 6 weeks.  Diet: routine diet Anticipated Birth Control: POPs Postpartum Appointment:6 weeks Additional Postpartum F/U: none Future Appointments:No future appointments. Follow up Visit:  [facility]. Go in 6 week(s).   Specialty: Obstetrics and Gynecology Contact information: [address] [place]  [id number] [phone]                  10/25/2020 Arrie Eastern, CNM

## 2020-10-25 NOTE — Lactation Note (Signed)
This note was copied from a baby's chart. Lactation Consultation Note  Patient Name: Mackenzie Garcia [id number]'[id number] Date: 10/25/2020 Reason for consult: Initial assessment;Early term 37-38.6wks Age:36 hours, infant had 2 voids and 2 stools since birth. Per mom, infant will not latch on her left breast, she has been breastfeeding infant on right breast only and would like latch assistance with her left breast.  LC observed mom has inverted nipples that when pre-pump completely everts outward. LC entered room infant was very fussy, mom gave infant 4 mls of EBM that she had previously pumped with DEBP prior to [facility] entering the room. Infant appeared calmer afterwards, mom latched infant on her left breast using the football hold, infant was off and on breast and not sustaining latch. Mom changed breastfeeding position to the cross cradle hold, infant sustained latch, swallows heard, " cuh' sound and infant was still breastfeeding after 22 minutes when LC left the room. Mom will continue to breastfeed infant according to hunger cues, 8 to 12+ times within 24 hours, STS.   LC discussed infant's input and out put with parents. Mom shown how to use DEBP & how to disassemble, clean, & reassemble parts. Mom made aware of O/P services, breastfeeding support groups, community resources, and our phone # for post-discharge questions.  Mom's feeding plans: 1- Mom will pre-pump breast with hand pump prior to latching infant at breast and BF infant according to feeding cues. 2- Mom will latch infant on both breast during a feeding. 3- Mom will ask RN or LC  for latch assistance if needed. 4- Mom will continue to use DEBP her choice, pumping every 3 hours for 15 minutes on initial setting, after latching infant at the breast give infant back any EBM that is pumped.   Maternal Data Has patient been taught Hand Expression?: Yes Does the patient have breastfeeding experience prior to this delivery?: Yes How long did  the patient breastfeed?: Per mom, she had latch difficultes with 1&2 nd child she only BF for 2 months.  Feeding Mother's Current Feeding Choice: Breast Milk  LATCH Score Latch: Grasps breast easily, tongue down, lips flanged, rhythmical sucking.  Audible Swallowing: Spontaneous and intermittent  Type of Nipple: Inverted  Comfort (Breast/Nipple): Soft / non-tender  Hold (Positioning): Assistance needed to correctly position infant at breast and maintain latch.  LATCH Score: 7   Lactation Tools Discussed/Used Tools: Pump Breast pump type: Manual;Double-Electric Breast Pump Pump Education: Setup, frequency, and cleaning;Milk Storage Reason for Pumping: help evert nipple shaft out more to help with latch, mom has inverted nipples. mom was set up with DEBP prior to [facility] entering room. Per mom infant would not latch on her left breast prior to latch assistance from [facility]. Pumping frequency: Mom will pump every 3 hours for 15 minutes on inital setting. Pumped volume: 7 mL  Interventions Interventions: Breast feeding basics reviewed;Assisted with latch;Skin to skin;Pre-pump if needed;Adjust position;Breast compression;Support pillows;Position options;Expressed milk;Hand pump;DEBP;Education  Discharge Pump: Manual;DEBP WIC Program: Yes  Consult Status Consult Status: Follow-up Date: 10/25/20 Follow-up type: In-patient    Danelle Earthly 10/25/2020, 12:16 AM

## 2020-10-25 NOTE — Progress Notes (Signed)
PPD# 1  Information for the patient's newborn:  Cressida, Milford [[id number]]  female    Baby Name Ciani Circumcision N/A   S:   Reports feeling very good Tolerating PO fluid and solids No nausea or vomiting Bleeding is light Pain controlled with PO meds Up ad lib / ambulatory / voiding w/o difficulty Feeding: Breast    O:   VS: BP 114/67 (BP Location: Left Arm)   Pulse 84   Temp 97.7 F (36.5 C) (Oral)   Resp 17   Ht 5' 5.5" (1.664 m)   Wt 88 kg   LMP 02/06/2020   SpO2 99%   Breastfeeding Unknown   BMI 31.79 kg/m   LABS:  Recent Labs    10/24/20 0601 10/25/20 0608  WBC 11.2* 9.7  HGB 12.6 10.8*  PLT 244 216   Blood type: --/--/A POS (05/31 0603) Rubella: Immune (11/15 0000)                      I&O: Intake/Output      05/31 0701 06/01 0700 06/01 0701 06/02 0700   Urine (mL/kg/hr) 200 (0.1)    Blood 250    Total Output 450    Net -450         Breastfed 1 x    Urine Occurrence 1 x      Physical Exam: Alert and oriented X3 Lungs: Clear and unlabored Heart: regular rate and rhythm / no mumurs Abdomen: soft, non-tender, non-distended  Fundus: firm, non-tender Perineum: non-edematous Lochia: appropriate Extremities: no edema, no calf pain or tenderness    A:  PPD # 1  Normal exam  P:  Routine post partum orders Anticipate D/C later today if infant is cleared by Peds or tomorrow.

## 2020-12-04 DIAGNOSIS — Z308 Encounter for other contraceptive management: Secondary | ICD-10-CM | POA: Diagnosis not present

## 2020-12-21 DIAGNOSIS — Z308 Encounter for other contraceptive management: Secondary | ICD-10-CM | POA: Diagnosis not present

## 2021-02-04 ENCOUNTER — Other Ambulatory Visit: Payer: Self-pay

## 2021-02-04 ENCOUNTER — Emergency Department (HOSPITAL_BASED_OUTPATIENT_CLINIC_OR_DEPARTMENT_OTHER): Payer: Medicaid Other

## 2021-02-04 ENCOUNTER — Encounter (HOSPITAL_BASED_OUTPATIENT_CLINIC_OR_DEPARTMENT_OTHER): Payer: Self-pay | Admitting: *Deleted

## 2021-02-04 ENCOUNTER — Emergency Department (HOSPITAL_BASED_OUTPATIENT_CLINIC_OR_DEPARTMENT_OTHER)
Admission: EM | Admit: 2021-02-04 | Discharge: 2021-02-04 | Disposition: A | Discharge status: Home / Self Care | Payer: Medicaid Other | Attending: Emergency Medicine | Admitting: Emergency Medicine

## 2021-02-04 DIAGNOSIS — M25532 Pain in left wrist: Secondary | ICD-10-CM | POA: Diagnosis not present

## 2021-02-04 DIAGNOSIS — S6992XA Unspecified injury of left wrist, hand and finger(s), initial encounter: Secondary | ICD-10-CM | POA: Diagnosis not present

## 2021-02-04 DIAGNOSIS — U071 COVID-19: Secondary | ICD-10-CM | POA: Insufficient documentation

## 2021-02-04 DIAGNOSIS — W1839XA Other fall on same level, initial encounter: Secondary | ICD-10-CM | POA: Insufficient documentation

## 2021-02-04 LAB — RESP PANEL BY RT-PCR (FLU A&B, COVID) ARPGX2
Influenza A by PCR: NEGATIVE
Influenza B by PCR: NEGATIVE
SARS Coronavirus 2 by RT PCR: POSITIVE — AB

## 2021-02-04 NOTE — ED Provider Notes (Signed)
[facility] EMERGENCY DEPT Provider Note   CSN: [id number] Arrival date & time: 02/04/21  7096     History Chief Complaint  Patient presents with   Covid Positive   Wrist Injury    Mackenzie Garcia is a 35 y.o. female.   Wrist Injury Associated symptoms: no back pain, no fever and no neck pain   Patient presents for continued pain following a wrist injury 9 days ago.  At the time of injury, she went to sit on a chair and slipped off.  She landed on the floor on an outstretched left hand.  She has had wrist pain since that time.  Wrist pain is most prominent on the lateral aspect.  She has been utilizing an Ace wrap for support.  She has noticed that her pain worsens with wrist extension and with lifting objects, including her 20-month-old daughter.  Due to the persistence of the pain, patient presents to the ED.    Past Medical History:  Diagnosis Date   Anxiety    Chlamydia    Depression    Eczema    Vaginal Pap smear, abnormal     Patient Active Problem List   Diagnosis Date Noted   Normal labor 10/24/2020   SVD (spontaneous vaginal delivery) 10/24/2020   Normal postpartum course 10/24/2020   Maternal age 83+, multigravida, antepartum 10/04/2020   Abnormal chromosomal and genetic finding on antenatal screening of mother 05/04/2020   Abnormal cervical Papanicolaou smear 04/21/2019    Past Surgical History:  Procedure Laterality Date   LEEP       OB History     Gravida  3   Para  3   Term  3   Preterm      AB      Living  3      SAB      IAB      Ectopic      Multiple  0   Live Births  3           Family History  Problem Relation Age of Onset   Diabetes Father    Diabetes Maternal Uncle    Diabetes Maternal Grandmother    Healthy Mother    Healthy Sister    Healthy Brother    Healthy Sister     Social History   Tobacco Use   Smoking status: Never   Smokeless tobacco: Never  Vaping Use   Vaping Use: Never used   Substance Use Topics   Alcohol use: No   Drug use: No    Home Medications Prior to Admission medications   Medication Sig Start Date End Date Taking? Authorizing Provider  acetaminophen (TYLENOL) 325 MG tablet Take 2 tablets (650 mg total) by mouth every 4 (four) hours as needed (for pain scale < 4). 10/25/20  Yes Roma Schanz, CNM  ibuprofen (ADVIL) 600 MG tablet Take 1 tablet (600 mg total) by mouth every 6 (six) hours. 10/25/20  Yes Roma Schanz, CNM  Prenatal Vit-Fe Fumarate-FA (PRENATAL MULTIVITAMIN) TABS tablet Take 1 tablet by mouth daily at 12 noon.   Yes [provider]  triamcinolone ointment (KENALOG) 0.1 % triamcinolone acetonide 0.1 % topical ointment  APPLY A THIN LAYER TO THE AFFECTED AREA(S) BY TOPICAL ROUTE 2 TIMES PER DAY   Yes [provider]    Allergies    Banana and Soy allergy  Review of Systems   Review of Systems  Constitutional:  Negative for chills and  fever.  HENT:  Negative for ear pain and sore throat.   Eyes:  Negative for pain and visual disturbance.  Respiratory:  Negative for cough and shortness of breath.   Cardiovascular:  Negative for chest pain and palpitations.  Gastrointestinal:  Negative for abdominal pain and vomiting.  Genitourinary:  Negative for dysuria and hematuria.  Musculoskeletal:  Positive for arthralgias. Negative for back pain, joint swelling, myalgias and neck pain.  Skin:  Negative for color change and rash.  Neurological:  Negative for seizures, syncope, weakness and numbness.  All other systems reviewed and are negative.  Physical Exam Updated Vital Signs BP 127/86 (BP Location: Right Arm)   Pulse 92   Temp 99.1 F (37.3 C) (Oral)   Resp 16   Ht 5' 5.5" (1.664 m)   Wt 78 kg   SpO2 100%   BMI 28.19 kg/m   Physical Exam Vitals and nursing note reviewed.  Constitutional:      General: She is not in acute distress.    Appearance: Normal appearance. She is well-developed and normal weight. She  is not ill-appearing, toxic-appearing or diaphoretic.  HENT:     Head: Normocephalic and atraumatic.     Right Ear: External ear normal.     Left Ear: External ear normal.     Nose: Nose normal.  Eyes:     Extraocular Movements: Extraocular movements intact.     Conjunctiva/sclera: Conjunctivae normal.  Cardiovascular:     Rate and Rhythm: Normal rate and regular rhythm.     Heart sounds: No murmur heard. Pulmonary:     Effort: Pulmonary effort is normal. No respiratory distress.     Breath sounds: Normal breath sounds.  Abdominal:     General: Abdomen is flat.  Musculoskeletal:        General: Tenderness present. No swelling or deformity. Normal range of motion.     Cervical back: Normal range of motion and neck supple.  Skin:    General: Skin is warm and dry.     Findings: No bruising.  Neurological:     Mental Status: She is alert.     Sensory: No sensory deficit.     Motor: No weakness.  Psychiatric:        Mood and Affect: Mood normal.        Behavior: Behavior normal.        Thought Content: Thought content normal.        Judgment: Judgment normal.    ED Results / Procedures / Treatments   Labs (all labs ordered are listed, but only abnormal results are displayed) Labs Reviewed  RESP PANEL BY RT-PCR (FLU A&B, COVID) ARPGX2 - Abnormal; Notable for the following components:      Result Value   SARS Coronavirus 2 by RT PCR POSITIVE (*)    All other components within normal limits    EKG None  Radiology DG Wrist Complete Left  Result Date: 02/04/2021 CLINICAL DATA:  Status post fall with pain of the left wrist. EXAM: LEFT WRIST - COMPLETE 3+ VIEW COMPARISON:  None. FINDINGS: There is no evidence of fracture or dislocation. There is no evidence of arthropathy or other focal bone abnormality. Soft tissues are unremarkable. IMPRESSION: Negative. Electronically Signed   By: Sherian Rein M.D.   On: 02/04/2021 10:06    Procedures Procedures   Medications Ordered in  ED Medications - No data to display  ED Course  I have reviewed the triage vital signs and the nursing  notes.  Pertinent labs & imaging results that were available during my care of the patient were reviewed by me and considered in my medical decision making (see chart for details).    MDM Rules/Calculators/A&P                           Patient is a healthy 35 year old female who presents for persistent pain in her left wrist following a fall on an outstretched hand 9 days ago.  Well-appearing upon arrival.  No clear evidence of swelling or deformity to her wrist.  There are areas of tenderness in the region of snuffbox and lateral aspect of palmar surface.  Distal strength and sensation are intact.  X-ray imaging was obtained which did not show evidence of fracture.  Additionally, patient had a positive home COVID test and requested a formal COVID test in the ED.  This was ordered.  Patient was given a removable wrist splint and advised to follow-up with hand surgeon due to snuffbox tenderness.  Patient was discharged in good condition.  Final Clinical Impression(s) / ED Diagnoses Final diagnoses:  Left wrist pain    Rx / DC Orders ED Discharge Orders     None        Gloris Manchester, MD 02/04/21 1133

## 2021-02-04 NOTE — ED Triage Notes (Signed)
Pt fell Sept 2nd hurting left wrist. Also tested Positive with Covid Home test at midnight.

## 2021-02-20 DIAGNOSIS — M65839 Other synovitis and tenosynovitis, unspecified forearm: Secondary | ICD-10-CM | POA: Diagnosis not present

## 2021-02-20 DIAGNOSIS — M65832 Other synovitis and tenosynovitis, left forearm: Secondary | ICD-10-CM | POA: Diagnosis not present

## 2021-02-20 DIAGNOSIS — M25532 Pain in left wrist: Secondary | ICD-10-CM | POA: Diagnosis not present

## 2021-03-26 ENCOUNTER — Other Ambulatory Visit (HOSPITAL_BASED_OUTPATIENT_CLINIC_OR_DEPARTMENT_OTHER): Payer: Self-pay

## 2021-03-26 MED ORDER — FLUARIX QUADRIVALENT 0.5 ML IM SUSY
PREFILLED_SYRINGE | INTRAMUSCULAR | 0 refills | Status: AC
  Filled 2021-03-26: qty 0.5, 1d supply, fill #0

## 2021-03-27 DIAGNOSIS — M654 Radial styloid tenosynovitis [de Quervain]: Secondary | ICD-10-CM | POA: Diagnosis not present

## 2021-04-18 DIAGNOSIS — M654 Radial styloid tenosynovitis [de Quervain]: Secondary | ICD-10-CM | POA: Diagnosis not present

## 2021-07-13 DIAGNOSIS — M654 Radial styloid tenosynovitis [de Quervain]: Secondary | ICD-10-CM | POA: Diagnosis not present

## 2021-08-22 DIAGNOSIS — M654 Radial styloid tenosynovitis [de Quervain]: Secondary | ICD-10-CM | POA: Diagnosis not present

## 2021-08-25 ENCOUNTER — Other Ambulatory Visit: Payer: Self-pay

## 2021-08-25 ENCOUNTER — Emergency Department (HOSPITAL_BASED_OUTPATIENT_CLINIC_OR_DEPARTMENT_OTHER)
Admission: EM | Admit: 2021-08-25 | Discharge: 2021-08-25 | Disposition: A | Discharge status: Home / Self Care | Payer: Medicaid Other | Attending: Emergency Medicine | Admitting: Emergency Medicine

## 2021-08-25 ENCOUNTER — Encounter (HOSPITAL_BASED_OUTPATIENT_CLINIC_OR_DEPARTMENT_OTHER): Payer: Self-pay | Admitting: Emergency Medicine

## 2021-08-25 DIAGNOSIS — G8918 Other acute postprocedural pain: Secondary | ICD-10-CM | POA: Insufficient documentation

## 2021-08-25 DIAGNOSIS — M79642 Pain in left hand: Secondary | ICD-10-CM | POA: Diagnosis not present

## 2021-08-25 DIAGNOSIS — M79602 Pain in left arm: Secondary | ICD-10-CM | POA: Insufficient documentation

## 2021-08-25 NOTE — ED Provider Notes (Signed)
?[facility] EM[facility] DEPT ?Provider Note ? ? ?CSN: [id number] ?Arrival date & time: 08/25/21  1206 ? ?  ? ?History ? ?Chief Complaint  ?Patient presents with  ? Arm Pain  ? ? ?Mackenzie Garcia is a 36 y.o. female.  Patient presents to the emergency department complaining of left arm pain.  Patient had surgery for flexor tenosynovitis on Wednesday and used her left arm to brace herself in the shower to come following this morning.  She had increasing pain and was worried that her incision may have opened.  Patient was wearing Ace bandage at time of arrival.  Surgery was completed by Dr. Melvyn Novas.  No other relevant past medical history. ? ?HPI ? ?  ? ?Home Medications ?Prior to Admission medications   ?Medication Sig Start Date End Date Taking? Authorizing Provider  ?acetaminophen (TYLENOL) 325 MG tablet Take 2 tablets (650 mg total) by mouth every 4 (four) hours as needed (for pain scale < 4). 10/25/20   Roma Schanz, CNM  ?ibuprofen (ADVIL) 600 MG tablet Take 1 tablet (600 mg total) by mouth every 6 (six) hours. 10/25/20   Roma Schanz, CNM  ?influenza vac split quadrivalent PF (FLUARIX QUADRIVALENT) 0.5 ML injection Inject into the muscle. 03/26/21     ?Prenatal Vit-Fe Fumarate-FA (PRENATAL MULTIVITAMIN) TABS tablet Take 1 tablet by mouth daily at 12 noon.    [provider]  ?triamcinolone ointment (KENALOG) 0.1 % triamcinolone acetonide 0.1 % topical ointment ? APPLY A THIN LAYER TO THE AFFECTED AREA(S) BY TOPICAL ROUTE 2 TIMES PER DAY    [provider]  ?   ? ?Allergies    ?Banana and Soy allergy   ? ?Review of Systems   ?Review of Systems  ?Constitutional:  Negative for fever.  ?Respiratory:  Negative for shortness of breath.   ?Cardiovascular:  Negative for chest pain.  ?Musculoskeletal:  Positive for arthralgias.  ?     Pain in left arm/wrist at area of recent surgery  ?Skin:  Negative for wound.  ? ?Physical Exam ?Updated Vital Signs ?BP 118/77 (BP Location: Right Arm)    Pulse 95   Temp 98.4 ?F (36.9 ?C) (Oral)   Resp 20   Ht 5' 5.5" (1.664 m)   Wt 75.8 kg   LMP 08/19/2021 (Exact Date)   SpO2 99%   BMI 27.37 kg/m?  ?Physical Exam ?Vitals and nursing note reviewed.  ?Constitutional:   ?   General: She is not in acute distress. ?   Appearance: She is normal weight.  ?HENT:  ?   Head: Normocephalic.  ?Eyes:  ?   Conjunctiva/sclera: Conjunctivae normal.  ?Cardiovascular:  ?   Rate and Rhythm: Normal rate.  ?   Pulses: Normal pulses.  ?Pulmonary:  ?   Effort: Pulmonary effort is normal.  ?Musculoskeletal:     ?   General: Swelling (Minimal swelling at incision site) and tenderness (At incision site) present.  ?   Comments: Incision appears intact with steri-strips  ?Neurological:  ?   Mental Status: She is alert.  ? ? ?ED Results / Procedures / Treatments   ?Labs ?(all labs ordered are listed, but only abnormal results are displayed) ?Labs Reviewed - No data to display ? ?EKG ?None ? ?Radiology ?No results found. ? ?Procedures ?[facility] KitchenOrtho Injury Treatment ? ?Date/Time: 08/25/2021 12:33 PM ?Performed by: Darrick Grinder, PA-C ?Authorized by: Darrick Grinder, PA-C  ? ?Consent:  ?  Consent obtained:  Verbal ?  Consent given by:  Patient ?  Risks discussed:  Restricted joint movement ?  Alternatives discussed:  No treatmentInjury location: Left forearm. ?Pre-procedure neurovascular assessment: neurovascularly intact ? ?Anesthesia: ?Local anesthesia used: no ? ?Patient sedated: NoImmobilization: splint ?Splint type: thumb spica ?Supplies used: elastic bandage and cotton padding ?Post-procedure neurovascular assessment: post-procedure neurovascularly intact ? ?  ? ? ?Medications Ordered in ED ?Medications - No data to display ? ?ED Course/ Medical Decision Making/ A&P ?  ?                        ?Medical Decision Making ? ?Patient presents with post-op pain concern after recent flexor tenosynovitis surgery.  Patient is concerned about incision site. Incision site was inspected and is  intact. There is no reason for further workup at the emergency department. The patient does not have symptoms of a fracture and I see no utility in plain x-rays.  ? ?Wrist re-wrapped with stockingette, cast padding, and ace bandage. Patient is neurovascularly intact.  ? ?The patient has pain medicine prescribed by her surgeon. She will continue to use that. Recommend follow up with her surgeon Monday. Patient agrees with the plan ? ?Final Clinical Impression(s) / ED Diagnoses ?Final diagnoses:  ?Post-op pain  ? ? ?Rx / DC Orders ?ED Discharge Orders   ? ? None  ? ?  ? ? ?  ?Darrick Grinder, PA-C ?08/25/21 1239 ? ?  ?Margarita Grizzle, MD ?08/25/21 1526 ? ?

## 2021-08-25 NOTE — Discharge Instructions (Signed)
You were seen today for evaluation of your surgical site. The incision appears intact. I recommend follow up with your surgeon by calling Monday morning. Return to the emergency department for any life threatening conditions.   ?

## 2021-08-25 NOTE — ED Triage Notes (Signed)
Pt via pov from home with left arm pain. She had tendonitis surgery on Wednesday and used the left arm to brace herself in the shower this morning to keep from falling. She reports much more pain and is concerned that her incision might be opened up. Pt wearing ace bandage; no blood visible to outside. Pt alert & oriented, nad noted.  ?

## 2021-08-25 NOTE — ED Notes (Signed)
Arm and wrist rewrapped with gauze; padding; Kerlix and ace wrap ?

## 2021-11-26 ENCOUNTER — Ambulatory Visit (HOSPITAL_COMMUNITY)
Admission: EM | Admit: 2021-11-26 | Discharge: 2021-11-26 | Disposition: A | Discharge status: Home / Self Care | Payer: Medicaid Other | Attending: Emergency Medicine | Admitting: Emergency Medicine

## 2021-11-26 ENCOUNTER — Encounter (HOSPITAL_BASED_OUTPATIENT_CLINIC_OR_DEPARTMENT_OTHER): Payer: Self-pay

## 2021-11-26 ENCOUNTER — Encounter (HOSPITAL_COMMUNITY): Payer: Self-pay | Admitting: Emergency Medicine

## 2021-11-26 ENCOUNTER — Other Ambulatory Visit: Payer: Self-pay

## 2021-11-26 ENCOUNTER — Emergency Department (HOSPITAL_BASED_OUTPATIENT_CLINIC_OR_DEPARTMENT_OTHER)
Admission: EM | Admit: 2021-11-26 | Discharge: 2021-11-27 | Disposition: A | Discharge status: Home / Self Care | Payer: Medicaid Other | Attending: Emergency Medicine | Admitting: Emergency Medicine

## 2021-11-26 DIAGNOSIS — L03114 Cellulitis of left upper limb: Secondary | ICD-10-CM

## 2021-11-26 DIAGNOSIS — W57XXXD Bitten or stung by nonvenomous insect and other nonvenomous arthropods, subsequent encounter: Secondary | ICD-10-CM | POA: Diagnosis not present

## 2021-11-26 DIAGNOSIS — S50862D Insect bite (nonvenomous) of left forearm, subsequent encounter: Secondary | ICD-10-CM | POA: Diagnosis not present

## 2021-11-26 MED ORDER — DOXYCYCLINE HYCLATE 100 MG PO CAPS: 100.0000 mg | ORAL_CAPSULE | Freq: Two times a day (BID) | ORAL | 0 refills | Status: AC

## 2021-11-26 NOTE — ED Triage Notes (Signed)
Patient c/o possible spider bite on left forearm x 1 day.  The area is red, tender and painful.  Patient has taken Benadryl last night.

## 2021-11-26 NOTE — Discharge Instructions (Addendum)
Today you are being treated for an infection of the soft tissues of the skin.  This condition is caused by bacteria. The bacteria enter through a break in the skin, such as a cut, burn, insect bite, open sore, or crack.  Take doxycycline every morning and every evening for the next 7 days, ideally should start to see improvement after 48 hours  You may use ice or warm compresses over the affected area 10 to 15-minute intervals for comfort  You may use Tylenol or ibuprofen as needed for pain  If your symptoms continue to worsen or persist or you begin to have fever or chills please go to the nearest emergency department for evaluation for IV medicine

## 2021-11-26 NOTE — ED Provider Notes (Signed)
[facility]    CSN: [id number] Arrival date & time: 11/26/21  1111      History   Chief Complaint Chief Complaint  Patient presents with   Insect Bite    HPI Mackenzie Garcia is a 36 y.o. female.   Patient presents with insect bite to the left forearm beginning 1 day ago.  Unsure of which insect but did kill insect when she swatted her hand as she felt the bite.  Endorses area became more red and and hot last night, had increased in size and become painful this morning.  Noticed drainage this morning expelling from site.tempted to cleanse with alcohol and applied antibacterial topical cream.  Denies fever or chills.    Past Medical History:  Diagnosis Date   Anxiety    Chlamydia    Depression    Eczema    Vaginal Pap smear, abnormal     Patient Active Problem List   Diagnosis Date Noted   Normal labor 10/24/2020   SVD (spontaneous vaginal delivery) 10/24/2020   Normal postpartum course 10/24/2020   Maternal age 29+, multigravida, antepartum 10/04/2020   Abnormal chromosomal and genetic finding on antenatal screening of mother 05/04/2020   Abnormal cervical Papanicolaou smear 04/21/2019    Past Surgical History:  Procedure Laterality Date   LEEP      OB History     Gravida  3   Para  3   Term  3   Preterm      AB      Living  3      SAB      IAB      Ectopic      Multiple  0   Live Births  3            Home Medications    Prior to Admission medications   Medication Sig Start Date End Date Taking? Authorizing Provider  Prenatal Vit-Fe Fumarate-FA (PRENATAL MULTIVITAMIN) TABS tablet Take 1 tablet by mouth daily at 12 noon.   Yes [provider]  acetaminophen (TYLENOL) 325 MG tablet Take 2 tablets (650 mg total) by mouth every 4 (four) hours as needed (for pain scale < 4). 10/25/20   Roma Schanz, CNM  ibuprofen (ADVIL) 600 MG tablet Take 1 tablet (600 mg total) by mouth every 6 (six) hours. 10/25/20   Roma Schanz,  CNM  influenza vac split quadrivalent PF (FLUARIX QUADRIVALENT) 0.5 ML injection Inject into the muscle. 03/26/21     triamcinolone ointment (KENALOG) 0.1 % triamcinolone acetonide 0.1 % topical ointment  APPLY A THIN LAYER TO THE AFFECTED AREA(S) BY TOPICAL ROUTE 2 TIMES PER DAY    [provider]    Family History Family History  Problem Relation Age of Onset   Diabetes Father    Diabetes Maternal Uncle    Diabetes Maternal Grandmother    Healthy Mother    Healthy Sister    Healthy Brother    Healthy Sister     Social History Social History   Tobacco Use   Smoking status: Never   Smokeless tobacco: Never  Vaping Use   Vaping Use: Never used  Substance Use Topics   Alcohol use: No   Drug use: No     Allergies   Banana and Soy allergy   Review of Systems Review of Systems  Constitutional: Negative.   Respiratory: Negative.    Cardiovascular: Negative.   Skin:  Positive for wound. Negative for color change,  pallor and rash.  Neurological: Negative.      Physical Exam Triage Vital Signs ED Triage Vitals  Enc Vitals Group     BP 11/26/21 1213 114/65     Pulse Rate 11/26/21 1213 79     Resp 11/26/21 1213 18     Temp 11/26/21 1213 98.3 F (36.8 C)     Temp Source 11/26/21 1213 Oral     SpO2 11/26/21 1213 100 %     Weight 11/26/21 1216 167 lb (75.8 kg)     Height 11/26/21 1216 5' 5.5" (1.664 m)     Head Circumference --      Peak Flow --      Pain Score 11/26/21 1215 6     Pain Loc --      Pain Edu? --      Excl. in GC? --    No data found.  Updated Vital Signs BP 114/65 (BP Location: Right Arm)   Pulse 79   Temp 98.3 F (36.8 C) (Oral)   Resp 18   Ht 5' 5.5" (1.664 m)   Wt 167 lb (75.8 kg)   LMP 10/31/2021 (Exact Date)   SpO2 100%   Breastfeeding No   BMI 27.37 kg/m   Visual Acuity Right Eye Distance:   Left Eye Distance:   Bilateral Distance:    Right Eye Near:   Left Eye Near:    Bilateral Near:     Physical  Exam Constitutional:      Appearance: Normal appearance.  HENT:     Head: Normocephalic.  Eyes:     Extraocular Movements: Extraocular movements intact.  Pulmonary:     Effort: Pulmonary effort is normal.  Skin:    Comments: Less than 0.5 cm puncture wound present to the anterior of the left forearm with surrounding erythema and mild swelling, site is warm to touch and tender, unable to expel drainage  Neurological:     Mental Status: She is alert and oriented to person, place, and time. Mental status is at baseline.  Psychiatric:        Mood and Affect: Mood normal.        Behavior: Behavior normal.      UC Treatments / Results  Labs (all labs ordered are listed, but only abnormal results are displayed) Labs Reviewed - No data to display  EKG   Radiology No results found.  Procedures Procedures (including critical care time)  Medications Ordered in UC Medications - No data to display  Initial Impression / Assessment and Plan / UC Course  I have reviewed the triage vital signs and the nursing notes.  Pertinent labs & imaging results that were available during my care of the patient were reviewed by me and considered in my medical decision making (see chart for details).  Cellulitis of the left upper arm  Presentation is consistent with cellulitis possibly related to insect bite as puncture wounds are at the center, discussed with patient, doxycycline 7-day course prescribed, recommend ice and heat over the affected area as well as Tylenol or ibuprofen for management of discomfort, given strict precautions for persisting or worsening infection to go to the nearest emergency department for evaluation for IV antibiotics, verbalized understanding Final Clinical Impressions(s) / UC Diagnoses   Final diagnoses:  None   Discharge Instructions   None    ED Prescriptions   None    PDMP not reviewed this encounter.   Valinda Hoar, NP 11/26/21 1246

## 2021-11-26 NOTE — ED Notes (Signed)
Insect bite yesterday, increased swelling and redness Was seen at [facility] and prescribed Doxy

## 2021-11-26 NOTE — ED Triage Notes (Signed)
Patient here POV from Home.  Endorses being bit yesterday by an insect and noting Swelling and Redness that began today. Located on Left Anterior Proximal Forearm.   Was seen by UC today and was prescribed Doxycycline and was instructed to Seek ED Evaluation if Pain/Redness worsened.   More Recently Chills. No N/V/D.   NAD Noted during Triage. A&Ox4. GCS 15. Ambulatory.

## 2021-11-27 NOTE — ED Provider Notes (Signed)
[facility] EMERGENCY DEPT Provider Note  CSN: [id number] Arrival date & time: 11/26/21 2019  Chief Complaint(s) Insect Bite  HPI Mackenzie Garcia is a 36 y.o. female here for left arm pain and swelling after an insect bite.  Patient was seen earlier today at urgent care and treated with doxycycline for possible cellulitis.  She sustained a insect bite to the left forearm the day prior.  She is unsure the type of insect.  Patient noted area later on that evening while taking a shower.  She returns to the ED tonight because she noted that the redness is spreading beyond the marked areas from earlier in the day.  HPI  Past Medical History Past Medical History:  Diagnosis Date   Anxiety    Chlamydia    Depression    Eczema    Vaginal Pap smear, abnormal    Patient Active Problem List   Diagnosis Date Noted   Normal labor 10/24/2020   SVD (spontaneous vaginal delivery) 10/24/2020   Normal postpartum course 10/24/2020   Maternal age 62+, multigravida, antepartum 10/04/2020   Abnormal chromosomal and genetic finding on antenatal screening of mother 05/04/2020   Abnormal cervical Papanicolaou smear 04/21/2019   Home Medication(s) Prior to Admission medications   Medication Sig Start Date End Date Taking? Authorizing Provider  acetaminophen (TYLENOL) 325 MG tablet Take 2 tablets (650 mg total) by mouth every 4 (four) hours as needed (for pain scale < 4). 10/25/20   Roma Schanz, CNM  doxycycline (VIBRAMYCIN) 100 MG capsule Take 1 capsule (100 mg total) by mouth 2 (two) times daily. 11/26/21   White, Elita Boone, NP  ibuprofen (ADVIL) 600 MG tablet Take 1 tablet (600 mg total) by mouth every 6 (six) hours. 10/25/20   Roma Schanz, CNM  influenza vac split quadrivalent PF (FLUARIX QUADRIVALENT) 0.5 ML injection Inject into the muscle. 03/26/21     Prenatal Vit-Fe Fumarate-FA (PRENATAL MULTIVITAMIN) TABS tablet Take 1 tablet by mouth daily at 12 noon.    [provider]  triamcinolone ointment (KENALOG) 0.1 % triamcinolone acetonide 0.1 % topical ointment  APPLY A THIN LAYER TO THE AFFECTED AREA(S) BY TOPICAL ROUTE 2 TIMES PER DAY    [provider]                                                                                                                                    Allergies Banana and Soy allergy  Review of Systems Review of Systems As noted in HPI  Physical Exam Vital Signs  I have reviewed the triage vital signs BP 122/76 (BP Location: Right Arm)   Pulse 82   Temp 99 F (37.2 C)   Resp 17   Ht 5' 5.5" (1.664 m)   Wt 75.8 kg   LMP 10/31/2021 (Exact Date)   SpO2 100%   BMI 27.39 kg/m   Physical Exam Vitals reviewed.  Constitutional:  General: She is not in acute distress.    Appearance: She is well-developed. She is not diaphoretic.  HENT:     Head: Normocephalic and atraumatic.     Right Ear: External ear normal.     Left Ear: External ear normal.     Nose: Nose normal.  Eyes:     General: No scleral icterus.    Conjunctiva/sclera: Conjunctivae normal.  Neck:     Trachea: Phonation normal.  Cardiovascular:     Rate and Rhythm: Normal rate and regular rhythm.  Pulmonary:     Effort: Pulmonary effort is normal. No respiratory distress.     Breath sounds: No stridor.  Abdominal:     General: There is no distension.  Musculoskeletal:        General: Normal range of motion.     Left forearm: Swelling and tenderness present. No bony tenderness.       Arms:     Cervical back: Normal range of motion.  Neurological:     Mental Status: She is alert and oriented to person, place, and time.  Psychiatric:        Behavior: Behavior normal.     ED Results and Treatments Labs (all labs ordered are listed, but only abnormal results are displayed) Labs Reviewed - No data to display                                                                                                                       EKG   EKG Interpretation  Date/Time:    Ventricular Rate:    PR Interval:    QRS Duration:   QT Interval:    QTC Calculation:   R Axis:     Text Interpretation:         Radiology No results found.  Pertinent labs & imaging results that were available during my care of the patient were reviewed by me and considered in my medical decision making (see MDM for details).  Medications Ordered in ED Medications - No data to display                                                                                                                                   Procedures Procedures  (including critical care time)  Medical Decision Making / ED Course    Complexity of Problem:  Additional history obtained: Urgent care note detailed above above  Patient's  presenting problem/concern, DDX, and MDM listed below: Left arm pain and swelling status post insect bite Favoring local inflammatory reaction from bite/sting Patient already on antibiotics No other symptoms concerning for anaphylaxis. No need for labs or imaging    ED Course:    Assessment, Add'l Intervention, and Reassessment: Left arm pain and swelling status post insect bite Appears to be most consistent with local inflammatory reaction from the bite/sting Patient is already being treated for possible cellulitis with doxycycline She was informed that the doxycycline will take 2 to 3 days to start working and she may see increased swelling or spread in the meantime. Instructed to continue the antibiotic dosing. Patient will have the area rechecked in 2 to 3 days    Final Clinical Impression(s) / ED Diagnoses Final diagnoses:  Insect bite of left forearm, subsequent encounter   The patient appears reasonably screened and/or stabilized for discharge and I doubt any other medical condition or other [facility] requiring further screening, evaluation, or treatment in the ED at this time prior to discharge. Safe for discharge with  strict return precautions.  Disposition: Discharge  Condition: Good  I have discussed the results, Dx and Tx plan with the patient/family who expressed understanding and agree(s) with the plan. Discharge instructions discussed at length. The patient/family was given strict return precautions who verbalized understanding of the instructions. No further questions at time of discharge.    ED Discharge Orders     None        Follow Up: Leilani Able, MD [address] [place] [address] [phone]  Call  as needed           This chart was dictated using voice recognition software.  Despite best efforts to proofread,  errors can occur which can change the documentation meaning.    Nira Conn, MD 11/27/21 [phone]

## 2022-03-18 DIAGNOSIS — Z30011 Encounter for initial prescription of contraceptive pills: Secondary | ICD-10-CM | POA: Diagnosis not present

## 2022-03-18 DIAGNOSIS — Z131 Encounter for screening for diabetes mellitus: Secondary | ICD-10-CM | POA: Diagnosis not present

## 2022-03-18 DIAGNOSIS — Z01411 Encounter for gynecological examination (general) (routine) with abnormal findings: Secondary | ICD-10-CM | POA: Diagnosis not present

## 2022-03-18 DIAGNOSIS — N760 Acute vaginitis: Secondary | ICD-10-CM | POA: Diagnosis not present

## 2022-03-18 DIAGNOSIS — Z01419 Encounter for gynecological examination (general) (routine) without abnormal findings: Secondary | ICD-10-CM | POA: Diagnosis not present

## 2022-06-05 DIAGNOSIS — Z3041 Encounter for surveillance of contraceptive pills: Secondary | ICD-10-CM | POA: Diagnosis not present

## 2022-06-05 DIAGNOSIS — Z3009 Encounter for other general counseling and advice on contraception: Secondary | ICD-10-CM | POA: Diagnosis not present
